# Patient Record
Sex: Female | Born: 1965
Health system: Southern US, Community
[De-identification: ages and names within clinical notes are randomized; demographics above are authoritative.]

## PROBLEM LIST (undated history)

## (undated) DIAGNOSIS — I10 Essential (primary) hypertension: Secondary | ICD-10-CM

## (undated) DIAGNOSIS — N183 Chronic kidney disease, stage 3 unspecified: Secondary | ICD-10-CM

## (undated) DIAGNOSIS — G459 Transient cerebral ischemic attack, unspecified: Secondary | ICD-10-CM

## (undated) DIAGNOSIS — D649 Anemia, unspecified: Secondary | ICD-10-CM

## (undated) DIAGNOSIS — E785 Hyperlipidemia, unspecified: Secondary | ICD-10-CM

## (undated) HISTORY — DX: Anemia, unspecified: D64.9

## (undated) HISTORY — DX: Transient cerebral ischemic attack, unspecified: G45.9

## (undated) HISTORY — DX: Essential (primary) hypertension: I10

## (undated) HISTORY — DX: Hyperlipidemia, unspecified: E78.5

## (undated) HISTORY — PX: TUBAL LIGATION: SHX77

## (undated) HISTORY — DX: Chronic kidney disease, stage 3 unspecified: N18.30

## (undated) HISTORY — DX: Chronic kidney disease, stage 3 (moderate): N18.3

---

## 1999-08-19 ENCOUNTER — Ambulatory Visit (HOSPITAL_COMMUNITY): Admission: RE | Admit: 1999-08-19 | Discharge: 1999-08-19 | Payer: Self-pay | Admitting: Orthopedic Surgery

## 2000-08-24 ENCOUNTER — Emergency Department (HOSPITAL_COMMUNITY): Admission: EM | Admit: 2000-08-24 | Discharge: 2000-08-24 | Payer: Self-pay | Admitting: Emergency Medicine

## 2000-08-26 ENCOUNTER — Emergency Department (HOSPITAL_COMMUNITY): Admission: EM | Admit: 2000-08-26 | Discharge: 2000-08-26 | Payer: Self-pay | Admitting: Emergency Medicine

## 2000-08-28 ENCOUNTER — Emergency Department (HOSPITAL_COMMUNITY): Admission: EM | Admit: 2000-08-28 | Discharge: 2000-08-28 | Payer: Self-pay | Admitting: *Deleted

## 2001-02-19 ENCOUNTER — Ambulatory Visit: Admission: RE | Admit: 2001-02-19 | Discharge: 2001-02-21 | Payer: Self-pay | Admitting: Obstetrics

## 2001-10-15 ENCOUNTER — Ambulatory Visit (HOSPITAL_COMMUNITY): Admission: RE | Admit: 2001-10-15 | Discharge: 2001-10-15 | Payer: Self-pay | Admitting: Obstetrics

## 2001-10-15 ENCOUNTER — Encounter: Payer: Self-pay | Admitting: Obstetrics

## 2001-10-24 ENCOUNTER — Ambulatory Visit (HOSPITAL_COMMUNITY): Admission: RE | Admit: 2001-10-24 | Discharge: 2001-10-24 | Payer: Self-pay | Admitting: Obstetrics

## 2005-03-28 ENCOUNTER — Ambulatory Visit (HOSPITAL_COMMUNITY): Admission: RE | Admit: 2005-03-28 | Discharge: 2005-03-28 | Payer: Self-pay | Admitting: Obstetrics

## 2006-09-21 ENCOUNTER — Encounter: Admission: RE | Admit: 2006-09-21 | Discharge: 2006-09-21 | Payer: Self-pay | Admitting: Internal Medicine

## 2007-02-06 ENCOUNTER — Encounter: Admission: RE | Admit: 2007-02-06 | Discharge: 2007-02-06 | Payer: Self-pay | Admitting: Internal Medicine

## 2008-05-06 ENCOUNTER — Other Ambulatory Visit: Admission: RE | Admit: 2008-05-06 | Discharge: 2008-05-06 | Payer: Self-pay | Admitting: Gynecology

## 2008-05-18 ENCOUNTER — Ambulatory Visit: Payer: Self-pay | Admitting: Internal Medicine

## 2008-05-18 DIAGNOSIS — K219 Gastro-esophageal reflux disease without esophagitis: Secondary | ICD-10-CM | POA: Insufficient documentation

## 2008-05-22 ENCOUNTER — Telehealth: Payer: Self-pay | Admitting: Internal Medicine

## 2008-05-25 LAB — CONVERTED CEMR LAB
Basophils Absolute: 0.1 10*3/uL (ref 0.0–0.1)
Basophils Relative: 0.9 % (ref 0.0–3.0)
Eosinophils Relative: 2 % (ref 0.0–5.0)
Hemoglobin: 12.9 g/dL (ref 12.0–15.0)
Lymphocytes Relative: 28 % (ref 12.0–46.0)
MCHC: 34.3 g/dL (ref 30.0–36.0)
MCV: 83.7 fL (ref 78.0–100.0)
Neutro Abs: 5 10*3/uL (ref 1.4–7.7)
RBC: 4.5 M/uL (ref 3.87–5.11)
Vitamin B-12: 489 pg/mL (ref 211–911)

## 2010-04-05 ENCOUNTER — Inpatient Hospital Stay (HOSPITAL_COMMUNITY)
Admission: EM | Admit: 2010-04-05 | Discharge: 2010-04-08 | Payer: Self-pay | Source: Home / Self Care | Attending: Internal Medicine | Admitting: Internal Medicine

## 2010-05-15 ENCOUNTER — Encounter: Payer: Self-pay | Admitting: Internal Medicine

## 2010-05-15 ENCOUNTER — Encounter: Payer: Self-pay | Admitting: Obstetrics

## 2010-07-04 LAB — BASIC METABOLIC PANEL
BUN: 13 mg/dL (ref 6–23)
CO2: 24 mEq/L (ref 19–32)
Chloride: 105 mEq/L (ref 96–112)
Creatinine, Ser: 1.3 mg/dL — ABNORMAL HIGH (ref 0.4–1.2)
Glucose, Bld: 107 mg/dL — ABNORMAL HIGH (ref 70–99)

## 2010-07-04 LAB — ALDOSTERONE + RENIN ACTIVITY W/ RATIO: ALDO / PRA Ratio: 0.8 Ratio — ABNORMAL LOW (ref 0.9–28.9)

## 2010-07-04 LAB — CATECHOLAMINES, FRACTIONATED, URINE, 24 HOUR
Creatinine, Urine mg/day-CATEUR: 1.86 g/(24.h) (ref 0.63–2.50)
Dopamine 24 Hr Urine: 295 mcg/24 h (ref 52–480)
Total urine volume: 1500 mL

## 2010-07-04 LAB — METANEPHRINES, URINE, 24 HOUR
Metanephrines, Ur: 208 mcg/24 h — ABNORMAL HIGH (ref 58–203)
Normetanephrine, 24H Ur: 424 mcg/24 h (ref 88–649)
Volume, Urine-METAN: 1500 mL

## 2010-07-05 LAB — RAPID URINE DRUG SCREEN, HOSP PERFORMED
Amphetamines: NOT DETECTED
Barbiturates: NOT DETECTED
Cocaine: NOT DETECTED
Opiates: NOT DETECTED
Tetrahydrocannabinol: NOT DETECTED

## 2010-07-05 LAB — CBC
HCT: 34.6 % — ABNORMAL LOW (ref 36.0–46.0)
HCT: 37.3 % (ref 36.0–46.0)
MCHC: 32.9 g/dL (ref 30.0–36.0)
MCV: 81 fL (ref 78.0–100.0)
MCV: 81.8 fL (ref 78.0–100.0)
Platelets: 285 10*3/uL (ref 150–400)
RBC: 4.56 MIL/uL (ref 3.87–5.11)
RDW: 14.4 % (ref 11.5–15.5)
WBC: 10.4 10*3/uL (ref 4.0–10.5)
WBC: 8.8 10*3/uL (ref 4.0–10.5)

## 2010-07-05 LAB — BASIC METABOLIC PANEL
BUN: 12 mg/dL (ref 6–23)
Chloride: 107 mEq/L (ref 96–112)
Creatinine, Ser: 1.19 mg/dL (ref 0.4–1.2)
Glucose, Bld: 94 mg/dL (ref 70–99)
Potassium: 3.2 mEq/L — ABNORMAL LOW (ref 3.5–5.1)

## 2010-07-05 LAB — DIFFERENTIAL
Basophils Absolute: 0 10*3/uL (ref 0.0–0.1)
Basophils Relative: 0 % (ref 0–1)
Eosinophils Absolute: 0.1 10*3/uL (ref 0.0–0.7)
Eosinophils Relative: 1 % (ref 0–5)
Lymphocytes Relative: 21 % (ref 12–46)
Monocytes Absolute: 0.6 10*3/uL (ref 0.1–1.0)

## 2010-07-05 LAB — POCT I-STAT, CHEM 8
BUN: 15 mg/dL (ref 6–23)
Chloride: 106 mEq/L (ref 96–112)
Chloride: 106 mEq/L (ref 96–112)
HCT: 40 % (ref 36.0–46.0)
HCT: 40 % (ref 36.0–46.0)
Hemoglobin: 13.6 g/dL (ref 12.0–15.0)
Potassium: 3.9 mEq/L (ref 3.5–5.1)
Potassium: 3.9 mEq/L (ref 3.5–5.1)
Sodium: 141 mEq/L (ref 135–145)
Sodium: 142 mEq/L (ref 135–145)

## 2010-07-05 LAB — LIPID PANEL
HDL: 70 mg/dL (ref >39)
Total CHOL/HDL Ratio: 2.9 RATIO
VLDL: 4 mg/dL (ref 0–40)

## 2010-07-05 LAB — HEMOGLOBIN A1C: Hgb A1c MFr Bld: 5.5 % (ref <5.7)

## 2010-09-01 ENCOUNTER — Other Ambulatory Visit (HOSPITAL_COMMUNITY): Payer: Self-pay | Admitting: Family Medicine

## 2010-09-01 DIAGNOSIS — Z1231 Encounter for screening mammogram for malignant neoplasm of breast: Secondary | ICD-10-CM

## 2010-09-09 NOTE — Op Note (Signed)
[facility]  Patient:    [name], [name] Visit Number: [id number] MRN: [id number]          Service Type: DSU Location: [facility] Attending Physician:  Venita Sheffield Dictated by:   Kathreen Cosier, M.D. Proc. Date: 10/24/01 Admit Date:  10/24/2001 Discharge Date: 10/24/2001                             Operative Report  PREOPERATIVE DIAGNOSIS:       Multiparity.  POSTOPERATIVE DIAGNOSIS:      Multiparity.  OPERATION:                    Open laparoscopic tubal sterilization.  SURGEON:                      Kathreen Cosier, M.D.  ASSISTANT:  ANESTHESIA:                   General anesthesia.  ESTIMATED BLOOD LOSS:  DESCRIPTION OF PROCEDURE:     Under general anesthesia with the patient in lithotomy position, the abdomen, peritoneum, and vagina were prepped and draped and the bladder emptied with a straight catheter.  A weighted speculum was placed in the vagina, anterior lip of the cervix was grasped with a Hulka tenaculum.  In the umbilicus, a transverse incision was made and carried down to the fascia.  The fascia was cleaned, grasped with two Kochers, and the fascia and peritoneum opened with Mayo scissors.  The sleeve of the trocar was inserted intraperitoneally.  3 liters of carbon dioxide infused intraperitoneally.  The visualizing scope was inserted.  The tubes and ovaries were normal.  There was a fundal myoma about 4 cm and three 2 cm posterior myomas.  The right tube was grasped one inch from the cornua with cautery and the tube was cauterized in a total of five places moving lateral from the first site of cautery.  The procedure was done in the exact fashion on the other side.  The probe was removed and CO2 allowed to escape from the peritoneal cavity.  The fascia was closed with one stitch of 0 Dexon.  The skin was closed with subcuticular stitch of 3-0 plain.  The patient tolerated the procedure well and taken to  the recovery room in good condition. Dictated by:   Kathreen Cosier, M.D. Attending Physician:  Venita Sheffield DD:  10/24/01 TD:  10/28/01 Job: [id number]

## 2010-09-12 ENCOUNTER — Ambulatory Visit (HOSPITAL_COMMUNITY): Payer: Self-pay

## 2010-09-16 ENCOUNTER — Ambulatory Visit (HOSPITAL_COMMUNITY): Payer: Self-pay

## 2010-09-26 ENCOUNTER — Ambulatory Visit (HOSPITAL_COMMUNITY)
Admission: RE | Admit: 2010-09-26 | Discharge: 2010-09-26 | Disposition: A | Discharge status: Home / Self Care | Payer: Self-pay | Source: Ambulatory Visit | Attending: Family Medicine | Admitting: Family Medicine

## 2010-09-26 DIAGNOSIS — Z1231 Encounter for screening mammogram for malignant neoplasm of breast: Secondary | ICD-10-CM | POA: Insufficient documentation

## 2010-09-26 LAB — HM MAMMOGRAPHY: HM Mammogram: NORMAL

## 2010-12-06 LAB — HM DIABETES EYE EXAM: HM Diabetic Eye Exam: NORMAL

## 2011-03-25 DIAGNOSIS — G459 Transient cerebral ischemic attack, unspecified: Secondary | ICD-10-CM

## 2011-03-25 HISTORY — DX: Transient cerebral ischemic attack, unspecified: G45.9

## 2011-05-31 ENCOUNTER — Other Ambulatory Visit (INDEPENDENT_AMBULATORY_CARE_PROVIDER_SITE_OTHER): Payer: BC Managed Care – PPO

## 2011-05-31 ENCOUNTER — Encounter: Payer: Self-pay | Admitting: Internal Medicine

## 2011-05-31 ENCOUNTER — Ambulatory Visit (INDEPENDENT_AMBULATORY_CARE_PROVIDER_SITE_OTHER): Payer: BC Managed Care – PPO | Admitting: Internal Medicine

## 2011-05-31 VITALS — BP 194/122 | HR 71 | Temp 98.6°F | Resp 14 | Ht 62.0 in | Wt 183.5 lb

## 2011-05-31 DIAGNOSIS — R739 Hyperglycemia, unspecified: Secondary | ICD-10-CM

## 2011-05-31 DIAGNOSIS — K219 Gastro-esophageal reflux disease without esophagitis: Secondary | ICD-10-CM

## 2011-05-31 DIAGNOSIS — D649 Anemia, unspecified: Secondary | ICD-10-CM

## 2011-05-31 DIAGNOSIS — G459 Transient cerebral ischemic attack, unspecified: Secondary | ICD-10-CM

## 2011-05-31 DIAGNOSIS — I10 Essential (primary) hypertension: Secondary | ICD-10-CM

## 2011-05-31 DIAGNOSIS — R7309 Other abnormal glucose: Secondary | ICD-10-CM

## 2011-05-31 LAB — CBC WITH DIFFERENTIAL/PLATELET
Basophils Absolute: 0.1 10*3/uL (ref 0.0–0.1)
Eosinophils Absolute: 0.2 10*3/uL (ref 0.0–0.7)
HCT: 39.6 % (ref 36.0–46.0)
Hemoglobin: 13.2 g/dL (ref 12.0–15.0)
Lymphs Abs: 2.5 10*3/uL (ref 0.7–4.0)
MCHC: 33.4 g/dL (ref 30.0–36.0)
Monocytes Absolute: 0.5 10*3/uL (ref 0.1–1.0)
Neutro Abs: 7.2 10*3/uL (ref 1.4–7.7)
Platelets: 344 10*3/uL (ref 150.0–400.0)
RDW: 14.1 % (ref 11.5–14.6)

## 2011-05-31 LAB — HEMOGLOBIN A1C: Hgb A1c MFr Bld: 5.7 % (ref 4.6–6.5)

## 2011-05-31 LAB — URINALYSIS, ROUTINE W REFLEX MICROSCOPIC
Bilirubin Urine: NEGATIVE
Ketones, ur: NEGATIVE
Specific Gravity, Urine: 1.025 (ref 1.000–1.030)
Urine Glucose: NEGATIVE
Urobilinogen, UA: 0.2 (ref 0.0–1.0)

## 2011-05-31 LAB — IBC PANEL
Iron: 22 ug/dL — ABNORMAL LOW (ref 42–145)
Saturation Ratios: 5.4 % — ABNORMAL LOW (ref 20.0–50.0)
Transferrin: 289.1 mg/dL (ref 212.0–360.0)

## 2011-05-31 LAB — LIPID PANEL
Cholesterol: 217 mg/dL — ABNORMAL HIGH (ref 0–200)
Total CHOL/HDL Ratio: 3
Triglycerides: 25 mg/dL (ref 0.0–149.0)

## 2011-05-31 LAB — VITAMIN B12: Vitamin B-12: 437 pg/mL (ref 211–911)

## 2011-05-31 LAB — COMPREHENSIVE METABOLIC PANEL
ALT: 15 U/L (ref 0–35)
AST: 18 U/L (ref 0–37)
Alkaline Phosphatase: 67 U/L (ref 39–117)
BUN: 18 mg/dL (ref 6–23)
Calcium: 8.8 mg/dL (ref 8.4–10.5)
Chloride: 103 mEq/L (ref 96–112)
Creatinine, Ser: 1.3 mg/dL — ABNORMAL HIGH (ref 0.4–1.2)
Total Bilirubin: 0.3 mg/dL (ref 0.3–1.2)

## 2011-05-31 MED ORDER — OLMESARTAN-AMLODIPINE-HCTZ 20-5-12.5 MG PO TABS: 1.0000 | ORAL_TABLET | Freq: Every day | ORAL | Status: DC

## 2011-05-31 MED ORDER — ROSUVASTATIN CALCIUM 10 MG PO TABS: 10.0000 mg | ORAL_TABLET | Freq: Every day | ORAL | Status: DC

## 2011-05-31 NOTE — Assessment & Plan Note (Signed)
Recheck CBC and check vitamin levels as well

## 2011-05-31 NOTE — Assessment & Plan Note (Signed)
Check an a1c today 

## 2011-05-31 NOTE — Progress Notes (Signed)
Subjective:    Patient ID: [name], female    DOB: 1965-08-05, 46 y.o.   MRN: [id number]  Hypertension This is a chronic problem. The current episode started more than 1 year ago. The problem has been gradually worsening since onset. The problem is uncontrolled. Pertinent negatives include no anxiety, blurred vision, chest pain, headaches, malaise/fatigue, neck pain, orthopnea, palpitations, peripheral edema, PND, shortness of breath or sweats. There are no associated agents to hypertension. Past treatments include nothing. Compliance problems include exercise and diet.  Hypertensive end-organ damage includes CVA.  Anemia Presents for follow-up visit. There has been no abdominal pain, anorexia, bruising/bleeding easily, confusion, fever, leg swelling, light-headedness, malaise/fatigue, pallor, palpitations, paresthesias, pica or weight loss. Signs of blood loss that are present include menorrhagia. Signs of blood loss that are not present include hematemesis, hematochezia and melena. There are no compliance problems.       Review of Systems  Constitutional: Negative for fever, chills, weight loss, malaise/fatigue, diaphoresis, activity change, appetite change, fatigue and unexpected weight change.  HENT: Negative.  Negative for neck pain.   Eyes: Negative.  Negative for blurred vision.  Respiratory: Negative for apnea, cough, choking, chest tightness, shortness of breath, wheezing and stridor.   Cardiovascular: Negative for chest pain, palpitations, orthopnea, leg swelling and PND.  Gastrointestinal: Negative for nausea, vomiting, abdominal pain, diarrhea, constipation, melena, hematochezia, abdominal distention, anorexia and hematemesis.  Genitourinary: Positive for menorrhagia.  Musculoskeletal: Negative for back pain, joint swelling, arthralgias and gait problem.  Skin: Negative for color change, pallor, rash and wound.  Neurological: Negative for dizziness, tremors, seizures,  syncope, facial asymmetry, speech difficulty, weakness, light-headedness, numbness, headaches and paresthesias.  Hematological: Negative for adenopathy. Does not bruise/bleed easily.  Psychiatric/Behavioral: Negative.  Negative for confusion.       Objective:   Physical Exam  Vitals reviewed. Constitutional: She is oriented to person, place, and time. She appears well-developed and well-nourished.  HENT:  Head: Normocephalic and atraumatic.  Nose: Nose normal.  Mouth/Throat: Oropharynx is clear and moist. No oropharyngeal exudate.  Eyes: Conjunctivae are normal. Right eye exhibits no discharge. Left eye exhibits no discharge. No scleral icterus.  Neck: Normal range of motion. Neck supple. No JVD present. No tracheal deviation present. No thyromegaly present.  Cardiovascular: Normal rate, regular rhythm, normal heart sounds and intact distal pulses.  Exam reveals no gallop and no friction rub.   No murmur heard. Pulmonary/Chest: Effort normal and breath sounds normal. No stridor. No respiratory distress. She has no wheezes. She has no rales. She exhibits no tenderness.  Abdominal: Soft. Bowel sounds are normal. She exhibits no distension and no mass. There is no tenderness. There is no rebound and no guarding.  Musculoskeletal: Normal range of motion. She exhibits no edema and no tenderness.  Lymphadenopathy:    She has no cervical adenopathy.  Neurological: She is alert and oriented to person, place, and time. She has normal reflexes. She displays normal reflexes. No cranial nerve deficit. She exhibits normal muscle tone. Coordination normal.  Skin: Skin is warm and dry. No rash noted. She is not diaphoretic. No erythema. No pallor.  Psychiatric: She has a normal mood and affect. Her behavior is normal. Judgment and thought content normal.     Lab Results  Component Value Date   WBC 10.4 04/06/2010   HGB 11.4* 04/06/2010   HCT 34.6* 04/06/2010   PLT 285 04/06/2010   GLUCOSE 107*  04/07/2010   CHOL  Value: 201  ATP III CLASSIFICATION:  <200     mg/dL   Desirable  045-409  mg/dL   Borderline High  >=811    mg/dL   High       * 91/47/8295   TRIG 22 04/06/2010   HDL 70 04/06/2010   LDLCALC  Value: 127        Total Cholesterol/HDL:CHD Risk Coronary Heart Disease Risk Table                     Men   Women  1/2 Average Risk   3.4   3.3  Average Risk       5.0   4.4  2 X Average Risk   9.6   7.1  3 X Average Risk  23.4   11.0        Use the calculated Patient Ratio above and the CHD Risk Table to determine the patient's CHD Risk.        ATP III CLASSIFICATION (LDL):  <100     mg/dL   Optimal  621-308  mg/dL   Near or Above                    Optimal  130-159  mg/dL   Borderline  657-846  mg/dL   High  >962     mg/dL   Very High* 95/28/4132   NA 137 04/07/2010   K 4.7 HEMOLYZED SPECIMEN, RESULTS MAY BE AFFECTED 04/07/2010   CL 105 04/07/2010   CREATININE 1.30* 04/07/2010   BUN 13 04/07/2010   CO2 24 04/07/2010   TSH 2.328 04/06/2010   HGBA1C  Value: 5.5 (NOTE)                                                                       According to the ADA Clinical Practice Recommendations for 2011, when HbA1c is used as a screening test:   >=6.5%   Diagnostic of Diabetes Mellitus           (if abnormal result  is confirmed)  5.7-6.4%   Increased risk of developing Diabetes Mellitus  References:Diagnosis and Classification of Diabetes Mellitus,Diabetes Care,2011,34(Suppl 1):S62-S69 and Standards of Medical Care in         Diabetes - 2011,Diabetes Care,2011,34  (Suppl 1):S11-S61. 04/06/2010       Assessment & Plan:

## 2011-05-31 NOTE — Patient Instructions (Signed)

## 2011-05-31 NOTE — Assessment & Plan Note (Signed)
Reduce risk of CVA with BP control, asa, and statin therapy. She will also start lifestyle modifications with diet, exercise, and weight loss.

## 2011-05-31 NOTE — Assessment & Plan Note (Signed)
Start tribenzor

## 2011-06-01 ENCOUNTER — Encounter: Payer: Self-pay | Admitting: Internal Medicine

## 2011-08-31 ENCOUNTER — Telehealth: Payer: Self-pay | Admitting: Internal Medicine

## 2011-08-31 NOTE — Telephone Encounter (Signed)
Pt says tribenzor works and she desire samples before she have surgery on 09/11/11--pt ph# [phone]

## 2011-08-31 NOTE — Telephone Encounter (Signed)
Samples are on LaKisha's desk 

## 2011-09-04 NOTE — Telephone Encounter (Signed)
Patient notified

## 2011-09-07 ENCOUNTER — Telehealth: Payer: Self-pay

## 2011-09-07 MED ORDER — OLMESARTAN-AMLODIPINE-HCTZ 40-5-12.5 MG PO TABS: 1.0000 | ORAL_TABLET | Freq: Every day | ORAL | Status: DC

## 2011-09-07 NOTE — Telephone Encounter (Signed)
Ok to change strength to tribenzor 40 - 5 - 12.5 (from 20-5-12.5) - done per emr  Pt to continue to monitor BP at home, and please make ROV in 2-3 wks with Dr Yetta Barre

## 2011-09-07 NOTE — Telephone Encounter (Signed)
Faxed hardcopy to pharmacy. 

## 2011-09-07 NOTE — Telephone Encounter (Signed)
Called the patient left message to call back 

## 2011-09-07 NOTE — Telephone Encounter (Signed)
Pt called stating that Tribenzor has not been completely control her BP. Last night 153-98, 164/104 and this morning143/97. Pt denied HA, dizziness or blurred vision. Pt is concerned about reading especially because she is scheduled for hysterectomy Monday. Pt is requesting advisement from MD, can she take Tribenzor BID instead or can an additional medication be Rx'd for BP control. Please advise in Dr Yetta Barre' absence, thanks!

## 2011-09-08 ENCOUNTER — Encounter (HOSPITAL_COMMUNITY)
Admission: RE | Admit: 2011-09-08 | Discharge: 2011-09-08 | Disposition: A | Discharge status: Home / Self Care | Payer: BC Managed Care – PPO | Source: Ambulatory Visit | Attending: Obstetrics & Gynecology | Admitting: Obstetrics & Gynecology

## 2011-09-08 ENCOUNTER — Encounter (HOSPITAL_COMMUNITY): Payer: Self-pay

## 2011-09-08 ENCOUNTER — Other Ambulatory Visit: Payer: Self-pay

## 2011-09-08 ENCOUNTER — Telehealth: Payer: Self-pay | Admitting: *Deleted

## 2011-09-08 ENCOUNTER — Encounter (HOSPITAL_COMMUNITY): Payer: Self-pay | Admitting: Pharmacist

## 2011-09-08 LAB — BASIC METABOLIC PANEL
Calcium: 9.4 mg/dL (ref 8.4–10.5)
Creatinine, Ser: 1.2 mg/dL — ABNORMAL HIGH (ref 0.50–1.10)
GFR calc Af Amer: 62 mL/min — ABNORMAL LOW (ref >90)
Sodium: 136 mEq/L (ref 135–145)

## 2011-09-08 LAB — CBC
MCH: 25.9 pg — ABNORMAL LOW (ref 26.0–34.0)
MCV: 81 fL (ref 78.0–100.0)
Platelets: 381 10*3/uL (ref 150–400)
RDW: 15.7 % — ABNORMAL HIGH (ref 11.5–15.5)
WBC: 9.9 10*3/uL (ref 4.0–10.5)

## 2011-09-08 LAB — SURGICAL PCR SCREEN
MRSA, PCR: NEGATIVE
Staphylococcus aureus: NEGATIVE

## 2011-09-08 NOTE — Telephone Encounter (Signed)
[facility] informed of MD's advisement.

## 2011-09-08 NOTE — Patient Instructions (Addendum)
20 [name]  09/08/2011   Your procedure is scheduled on:  09/11/11  Enter through the Main Entrance of [facility] at 6 AM.  Pick up the phone at the desk and dial [phone].   Call this number if you have problems the morning of surgery: [phone]   Remember:   Do not eat food:After Midnight.  Do not drink clear liquids: After Midnight.  Take these medicines the morning of surgery with A SIP OF WATER: Blood pressure medication   Do not wear jewelry, make-up or nail polish.  Do not wear lotions, powders, or perfumes. You may wear deodorant.  Do not shave 48 hours prior to surgery.  Do not bring valuables to the hospital.  Contacts, dentures or bridgework may not be worn into surgery.  Leave suitcase in the car. After surgery it may be brought to your room.  For patients admitted to the hospital, checkout time is 11:00 AM the day of discharge.   Patients discharged the day of surgery will not be allowed to drive home.  Name and phone number of your driver: NA  Special Instructions: CHG Shower Use Special Wash: 1/2 bottle night before surgery and 1/2 bottle morning of surgery.   Please read over the following fact sheets that you were given: MRSA Information

## 2011-09-08 NOTE — Telephone Encounter (Signed)
I suspect even one dose will be too much and i would not recommend  Pt will need to start samples if needed now if cannot afford, may need to re-schedule surgury and will need OV with dr Yetta Barre in 3-5 days after start higher dose

## 2011-09-08 NOTE — Pre-Procedure Instructions (Signed)
History and EKG reviewed by Dr. Rodman Pickle. Call to Dr Jonny Ruiz to possibly double blood pressure medication per Dr Rodman Pickle request, but nurse states he does not feel comfortable in doing so. Dr. Rodman Pickle notified, no further orders given.

## 2011-09-08 NOTE — Telephone Encounter (Signed)
[facility] called-pt's BP is still elevated and pt has surgery on Monday for hysterectomy-pt went to pick up Tribenzor 40-5 12.5 mg dose but it was too expensive-they want to know if it is okay for pt to double her 20-5 12.5 dosage.

## 2011-09-10 MED ORDER — CEFAZOLIN SODIUM-DEXTROSE 2-3 GM-% IV SOLR
2.0000 g | INTRAVENOUS | Status: AC
  Administered 2011-09-11: 2 g via INTRAVENOUS
  Filled 2011-09-10: qty 50

## 2011-09-10 NOTE — H&P (Signed)
[name], [name]             ACCOUNT NO.:  [id number]  MEDICAL RECORD NO.:  [id number]  LOCATION:                                 FACILITY:  PHYSICIAN:  [name], M.D.   DATE OF BIRTH:  [birth year]  DATE OF ADMISSION:  09/11/2011 DATE OF DISCHARGE:                             HISTORY & PHYSICAL   ADMITTING DIAGNOSES:  Uterine fibroids/leiomyomata, clinical history of dysfunctional uterine bleeding and menorrhagia.  The patient is a 46 year old black single female, gravida 1, para 1, who had surgical sterilization in 2004 and was kindly referred by Dr. Teodora [name] who saw her for evaluation of her dysfunctional bleeding and annual examination in March of this year.  Subsequent D and C in the office revealed benign pathology.  Pelvic ultrasound in the office was remarkable for numerous leiomyomata, the 3 largest being 5.1 cm, 4.7 cm, 4.9 cm.  There were at least 3 others measuring more than or equal to 1.6 cm.  There was a simple ovarian cyst on the right.  The date of that ultrasound was July 27, 2011.  There was mild hydronephrosis on the right ureter.  The patient was seen in consultation with me and is admitted now for definitive surgery, specifically total abdominal hysterectomy, bilateral salpingo-oophorectomy.  Potential risks of the procedure have been discussed with the patient at length.  She has reviewed ACOG materials on the procedure which includes discussion of risk of blood clots in the legs, injury to other organs, infection, injury to vasculature with hemorrhage and risk of postoperative infection.  Appropriate prophylactic measures have also been discussed with the patient.  The option of retaining the ovaries has been discussed, and she has elected to proceed with bilateral salpingo- oophorectomy with postoperative plan for hormone replacement therapy.  CURRENT REVIEW OF SYSTEMS:  Otherwise negative.  She does not have any cardiopulmonary, GI, or GU  complaints.  PAST MEDICAL HISTORY:  The patient is known to have hypertension.  She has no other significant medical conditions.  Her only medication is Tribenzor 20 mg/5 mg/12.5 mg.  Her only known medication allergy is to tetanus.  She does not use cigarettes.  She does use alcohol but only socially.  Her primary care physician is [name].  FAMILY HISTORY:  Remarkable for kidney disease, for diabetes, for hypertension, and for cancer.  PHYSICAL EXAMINATION:  HEENT:  Grossly within normal limits. NECK:  The thyroid gland is not palpably enlarged to my examination. The patient had a blood pressure of 172/120 in the office but had been off her antihypertensive medication for 3 weeks, only restarted 2 days prior to her visit with me.  Her weight is 190.  Her height is 5 feet 2- 3/4 inches. CHEST:  Clear to auscultation throughout. HEART:  Normal sinus rhythm without murmurs, rubs, or gallops. ABDOMEN:  Soft and nontender.  There is no appreciable organomegaly except for the uterus palpable above the symphysis.  There is no CVA tenderness. EXTREMITIES:  Without cyanosis, clubbing, or edema. PELVIC:  External genitalia, vagina, and cervix were normal to inspection.  Bimanual exam reveals the uterus to be 14-[redacted] weeks gestational size and irregularly nodular.  The rectum is  palpably normal and confirms the above findings.  ASSESSMENT:  Numerous large leiomyomata.  Previous surgical sterilization.  Clinical symptoms of menorrhagia and dysfunctional bleeding with negative pathology and D and C specimen.  PLAN:  Total abdominal hysterectomy, bilateral salpingo-oophorectomy.     [name], M.D.     WRN/MEDQ  D:  09/10/2011  T:  09/10/2011  Job:  [id number]

## 2011-09-11 ENCOUNTER — Inpatient Hospital Stay (HOSPITAL_COMMUNITY): Payer: BC Managed Care – PPO | Admitting: Anesthesiology

## 2011-09-11 ENCOUNTER — Encounter (HOSPITAL_COMMUNITY): Payer: Self-pay | Admitting: General Practice

## 2011-09-11 ENCOUNTER — Inpatient Hospital Stay (HOSPITAL_COMMUNITY)
Admission: RE | Admit: 2011-09-11 | Discharge: 2011-09-13 | DRG: 359 | Disposition: A | Discharge status: Home / Self Care | Payer: BC Managed Care – PPO | Source: Ambulatory Visit | Attending: Obstetrics & Gynecology | Admitting: Obstetrics & Gynecology

## 2011-09-11 ENCOUNTER — Encounter (HOSPITAL_COMMUNITY)
Admission: RE | Disposition: A | Discharge status: Home / Self Care | Payer: Self-pay | Source: Ambulatory Visit | Attending: Obstetrics & Gynecology

## 2011-09-11 ENCOUNTER — Encounter (HOSPITAL_COMMUNITY): Payer: Self-pay | Admitting: Anesthesiology

## 2011-09-11 DIAGNOSIS — N8000 Endometriosis of the uterus, unspecified: Secondary | ICD-10-CM | POA: Diagnosis present

## 2011-09-11 DIAGNOSIS — Z01812 Encounter for preprocedural laboratory examination: Secondary | ICD-10-CM

## 2011-09-11 DIAGNOSIS — Z01818 Encounter for other preprocedural examination: Secondary | ICD-10-CM

## 2011-09-11 DIAGNOSIS — N920 Excessive and frequent menstruation with regular cycle: Secondary | ICD-10-CM | POA: Diagnosis present

## 2011-09-11 DIAGNOSIS — N938 Other specified abnormal uterine and vaginal bleeding: Secondary | ICD-10-CM | POA: Diagnosis present

## 2011-09-11 DIAGNOSIS — I10 Essential (primary) hypertension: Secondary | ICD-10-CM | POA: Diagnosis present

## 2011-09-11 DIAGNOSIS — N949 Unspecified condition associated with female genital organs and menstrual cycle: Secondary | ICD-10-CM | POA: Diagnosis present

## 2011-09-11 DIAGNOSIS — D252 Subserosal leiomyoma of uterus: Secondary | ICD-10-CM | POA: Diagnosis present

## 2011-09-11 DIAGNOSIS — N9489 Other specified conditions associated with female genital organs and menstrual cycle: Secondary | ICD-10-CM | POA: Diagnosis present

## 2011-09-11 DIAGNOSIS — D251 Intramural leiomyoma of uterus: Principal | ICD-10-CM | POA: Diagnosis present

## 2011-09-11 DIAGNOSIS — N831 Corpus luteum cyst of ovary, unspecified side: Secondary | ICD-10-CM | POA: Diagnosis present

## 2011-09-11 HISTORY — PX: ABDOMINAL HYSTERECTOMY: SHX81

## 2011-09-11 HISTORY — PX: SALPINGOOPHORECTOMY: SHX82

## 2011-09-11 SURGERY — HYSTERECTOMY, ABDOMINAL
Anesthesia: General | Site: Abdomen | Wound class: Clean Contaminated

## 2011-09-11 MED ORDER — SIMETHICONE 80 MG PO CHEW: 80.0000 mg | CHEWABLE_TABLET | Freq: Four times a day (QID) | ORAL | Status: DC | PRN

## 2011-09-11 MED ORDER — LACTATED RINGERS IV SOLN
INTRAVENOUS | Status: DC
  Administered 2011-09-11: 07:00:00 via INTRAVENOUS

## 2011-09-11 MED ORDER — HYDROMORPHONE HCL PF 1 MG/ML IJ SOLN
INTRAMUSCULAR | Status: DC | PRN
  Administered 2011-09-11: 1 mg via INTRAVENOUS

## 2011-09-11 MED ORDER — LABETALOL HCL 5 MG/ML IV SOLN
INTRAVENOUS | Status: DC | PRN
  Administered 2011-09-11 (×2): 5 mg via INTRAVENOUS

## 2011-09-11 MED ORDER — HYDROCHLOROTHIAZIDE 12.5 MG PO CAPS
12.5000 mg | ORAL_CAPSULE | Freq: Every day | ORAL | Status: DC
  Administered 2011-09-12 – 2011-09-13 (×2): 12.5 mg via ORAL
  Filled 2011-09-11 (×3): qty 1

## 2011-09-11 MED ORDER — MENTHOL 3 MG MT LOZG: 1.0000 | LOZENGE | OROMUCOSAL | Status: DC | PRN

## 2011-09-11 MED ORDER — ONDANSETRON HCL 4 MG PO TABS: 4.0000 mg | ORAL_TABLET | Freq: Four times a day (QID) | ORAL | Status: DC | PRN

## 2011-09-11 MED ORDER — HYDROMORPHONE HCL PF 1 MG/ML IJ SOLN
INTRAMUSCULAR | Status: AC
  Administered 2011-09-11: 0.5 mg via INTRAVENOUS
  Filled 2011-09-11: qty 1

## 2011-09-11 MED ORDER — PROPOFOL 10 MG/ML IV EMUL
INTRAVENOUS | Status: DC | PRN
  Administered 2011-09-11: 200 mg via INTRAVENOUS

## 2011-09-11 MED ORDER — LIDOCAINE HCL (CARDIAC) 20 MG/ML IV SOLN
INTRAVENOUS | Status: AC
  Filled 2011-09-11: qty 5

## 2011-09-11 MED ORDER — KETOROLAC TROMETHAMINE 30 MG/ML IJ SOLN
INTRAMUSCULAR | Status: AC
  Filled 2011-09-11: qty 1

## 2011-09-11 MED ORDER — SUFENTANIL CITRATE 50 MCG/ML IV SOLN
INTRAVENOUS | Status: AC
  Filled 2011-09-11: qty 1

## 2011-09-11 MED ORDER — OXYCODONE-ACETAMINOPHEN 5-325 MG PO TABS
1.0000 | ORAL_TABLET | ORAL | Status: DC | PRN
  Administered 2011-09-12 (×2): 2 via ORAL
  Administered 2011-09-13: 1 via ORAL
  Filled 2011-09-11 (×2): qty 2
  Filled 2011-09-11: qty 1

## 2011-09-11 MED ORDER — DEXTROSE-NACL 5-0.45 % IV SOLN
INTRAVENOUS | Status: DC
  Administered 2011-09-11: 17:00:00 via INTRAVENOUS

## 2011-09-11 MED ORDER — ZOLPIDEM TARTRATE 5 MG PO TABS: 5.0000 mg | ORAL_TABLET | Freq: Every evening | ORAL | Status: DC | PRN

## 2011-09-11 MED ORDER — LIDOCAINE HCL (CARDIAC) 20 MG/ML IV SOLN
INTRAVENOUS | Status: DC | PRN
  Administered 2011-09-11: 100 mg via INTRAVENOUS

## 2011-09-11 MED ORDER — DIPHENHYDRAMINE HCL 50 MG/ML IJ SOLN: 12.5000 mg | Freq: Four times a day (QID) | INTRAMUSCULAR | Status: DC | PRN

## 2011-09-11 MED ORDER — DEXAMETHASONE SODIUM PHOSPHATE 10 MG/ML IJ SOLN
INTRAMUSCULAR | Status: AC
  Filled 2011-09-11: qty 1

## 2011-09-11 MED ORDER — OLMESARTAN-AMLODIPINE-HCTZ 40-5-12.5 MG PO TABS: 1.0000 | ORAL_TABLET | Freq: Every day | ORAL | Status: DC

## 2011-09-11 MED ORDER — DEXAMETHASONE SODIUM PHOSPHATE 4 MG/ML IJ SOLN
INTRAMUSCULAR | Status: DC | PRN
  Administered 2011-09-11 (×2): 5 mg via INTRAVENOUS

## 2011-09-11 MED ORDER — 0.9 % SODIUM CHLORIDE (POUR BTL) OPTIME
TOPICAL | Status: DC | PRN
  Administered 2011-09-11: 1000 mL

## 2011-09-11 MED ORDER — NALOXONE HCL 0.4 MG/ML IJ SOLN: 0.4000 mg | INTRAMUSCULAR | Status: DC | PRN

## 2011-09-11 MED ORDER — DOCUSATE SODIUM 100 MG PO CAPS
100.0000 mg | ORAL_CAPSULE | Freq: Two times a day (BID) | ORAL | Status: DC
  Administered 2011-09-11 – 2011-09-12 (×3): 100 mg via ORAL
  Filled 2011-09-11 (×3): qty 1

## 2011-09-11 MED ORDER — ONDANSETRON HCL 4 MG/2ML IJ SOLN
INTRAMUSCULAR | Status: AC
  Filled 2011-09-11: qty 2

## 2011-09-11 MED ORDER — MIDAZOLAM HCL 5 MG/5ML IJ SOLN
INTRAMUSCULAR | Status: DC | PRN
  Administered 2011-09-11: .5 mg via INTRAVENOUS
  Administered 2011-09-11: 1.5 mg via INTRAVENOUS

## 2011-09-11 MED ORDER — HYDROMORPHONE HCL PF 1 MG/ML IJ SOLN
0.2500 mg | INTRAMUSCULAR | Status: DC | PRN
  Administered 2011-09-11 (×3): 0.5 mg via INTRAVENOUS

## 2011-09-11 MED ORDER — GLYCOPYRROLATE 0.2 MG/ML IJ SOLN
INTRAMUSCULAR | Status: AC
  Filled 2011-09-11: qty 2

## 2011-09-11 MED ORDER — DIPHENHYDRAMINE HCL 12.5 MG/5ML PO ELIX
12.5000 mg | ORAL_SOLUTION | Freq: Four times a day (QID) | ORAL | Status: DC | PRN
  Filled 2011-09-11: qty 5

## 2011-09-11 MED ORDER — SUFENTANIL CITRATE 50 MCG/ML IV SOLN
INTRAVENOUS | Status: DC | PRN
  Administered 2011-09-11: 20 ug via INTRAVENOUS
  Administered 2011-09-11 (×3): 10 ug via INTRAVENOUS

## 2011-09-11 MED ORDER — HYDROMORPHONE HCL PF 1 MG/ML IJ SOLN
INTRAMUSCULAR | Status: AC
  Filled 2011-09-11: qty 1

## 2011-09-11 MED ORDER — ONDANSETRON HCL 4 MG/2ML IJ SOLN: 4.0000 mg | Freq: Four times a day (QID) | INTRAMUSCULAR | Status: DC | PRN

## 2011-09-11 MED ORDER — HYDROMORPHONE 0.3 MG/ML IV SOLN
INTRAVENOUS | Status: DC
  Administered 2011-09-11: 5 mg via INTRAVENOUS
  Administered 2011-09-11: 11:00:00 via INTRAVENOUS
  Administered 2011-09-11: 1.5 mg via INTRAVENOUS
  Administered 2011-09-11: 5 mg via INTRAVENOUS
  Administered 2011-09-12 (×2): 0.6 mg via INTRAVENOUS
  Filled 2011-09-11: qty 25

## 2011-09-11 MED ORDER — ROCURONIUM BROMIDE 50 MG/5ML IV SOLN
INTRAVENOUS | Status: AC
  Filled 2011-09-11: qty 2

## 2011-09-11 MED ORDER — ONDANSETRON HCL 4 MG/2ML IJ SOLN
INTRAMUSCULAR | Status: DC | PRN
  Administered 2011-09-11 (×2): 2 mg via INTRAVENOUS

## 2011-09-11 MED ORDER — IRBESARTAN 300 MG PO TABS
300.0000 mg | ORAL_TABLET | Freq: Every day | ORAL | Status: DC
  Filled 2011-09-11: qty 1

## 2011-09-11 MED ORDER — IBUPROFEN 600 MG PO TABS
600.0000 mg | ORAL_TABLET | Freq: Four times a day (QID) | ORAL | Status: DC | PRN
  Administered 2011-09-12: 600 mg via ORAL
  Filled 2011-09-11 (×2): qty 1

## 2011-09-11 MED ORDER — BISACODYL 10 MG RE SUPP: 10.0000 mg | Freq: Every day | RECTAL | Status: DC | PRN

## 2011-09-11 MED ORDER — MIDAZOLAM HCL 2 MG/2ML IJ SOLN
INTRAMUSCULAR | Status: AC
  Filled 2011-09-11: qty 2

## 2011-09-11 MED ORDER — IRBESARTAN 300 MG PO TABS
300.0000 mg | ORAL_TABLET | Freq: Every day | ORAL | Status: DC
  Administered 2011-09-12 – 2011-09-13 (×2): 300 mg via ORAL
  Filled 2011-09-11 (×3): qty 1

## 2011-09-11 MED ORDER — LABETALOL HCL 5 MG/ML IV SOLN
INTRAVENOUS | Status: AC
  Filled 2011-09-11: qty 4

## 2011-09-11 MED ORDER — GLYCOPYRROLATE 0.2 MG/ML IJ SOLN
INTRAMUSCULAR | Status: DC | PRN
  Administered 2011-09-11: .8 mg via INTRAVENOUS

## 2011-09-11 MED ORDER — PROPOFOL 10 MG/ML IV EMUL
INTRAVENOUS | Status: AC
  Filled 2011-09-11: qty 20

## 2011-09-11 MED ORDER — AMLODIPINE BESYLATE 5 MG PO TABS
5.0000 mg | ORAL_TABLET | Freq: Every day | ORAL | Status: DC
  Administered 2011-09-12 – 2011-09-13 (×2): 5 mg via ORAL
  Filled 2011-09-11 (×3): qty 1

## 2011-09-11 MED ORDER — SODIUM CHLORIDE 0.9 % IJ SOLN: 9.0000 mL | INTRAMUSCULAR | Status: DC | PRN

## 2011-09-11 MED ORDER — NEOSTIGMINE METHYLSULFATE 1 MG/ML IJ SOLN
INTRAMUSCULAR | Status: DC | PRN
  Administered 2011-09-11: 5 mg via INTRAVENOUS

## 2011-09-11 MED ORDER — NEOSTIGMINE METHYLSULFATE 1 MG/ML IJ SOLN
INTRAMUSCULAR | Status: AC
  Filled 2011-09-11: qty 10

## 2011-09-11 MED ORDER — ROCURONIUM BROMIDE 100 MG/10ML IV SOLN
INTRAVENOUS | Status: DC | PRN
  Administered 2011-09-11: 50 mg via INTRAVENOUS

## 2011-09-11 MED ORDER — ALUM & MAG HYDROXIDE-SIMETH 200-200-20 MG/5ML PO SUSP: 30.0000 mL | ORAL | Status: DC | PRN

## 2011-09-11 SURGICAL SUPPLY — 37 items
CANISTER SUCTION 2500CC (MISCELLANEOUS) ×3 IMPLANT
CLOTH BEACON ORANGE TIMEOUT ST (SAFETY) ×3 IMPLANT
CONT PATH 16OZ SNAP LID 3702 (MISCELLANEOUS) ×3 IMPLANT
DECANTER SPIKE VIAL GLASS SM (MISCELLANEOUS) IMPLANT
DRSG COVADERM 4X10 (GAUZE/BANDAGES/DRESSINGS) ×3 IMPLANT
ELECT BLADE 6 FLAT ULTRCLN (ELECTRODE) IMPLANT
ELECT CAUTERY BLADE 6.4 (BLADE) ×3 IMPLANT
ELECT LIGASURE LONG (ELECTRODE) IMPLANT
GLOVE BIO SURGEON STRL SZ7.5 (GLOVE) ×3 IMPLANT
GOWN PREVENTION PLUS LG XLONG (DISPOSABLE) ×9 IMPLANT
GOWN PREVENTION PLUS XLARGE (GOWN DISPOSABLE) ×3 IMPLANT
NS IRRIG 1000ML POUR BTL (IV SOLUTION) ×3 IMPLANT
PACK ABDOMINAL GYN (CUSTOM PROCEDURE TRAY) ×3 IMPLANT
PAD ABD 7.5X8 STRL (GAUZE/BANDAGES/DRESSINGS) ×3 IMPLANT
PAD OB MATERNITY 4.3X12.25 (PERSONAL CARE ITEMS) ×3 IMPLANT
PROTECTOR NERVE ULNAR (MISCELLANEOUS) ×3 IMPLANT
SPONGE LAP 18X18 X RAY DECT (DISPOSABLE) ×3 IMPLANT
SPONGE SURGIFOAM ABS GEL 12-7 (HEMOSTASIS) IMPLANT
STAPLER VISISTAT 35W (STAPLE) ×3 IMPLANT
STRIP CLOSURE SKIN 1/4X4 (GAUZE/BANDAGES/DRESSINGS) ×3 IMPLANT
SUT CHROMIC 3 0 SH 27 (SUTURE) IMPLANT
SUT CHROMIC 3 0 TIES (SUTURE) IMPLANT
SUT ETHILON 3 0 FSL (SUTURE) IMPLANT
SUT MNCRL 0 MO-4 VIOLET 18 CR (SUTURE) ×4 IMPLANT
SUT MNCRL 0 VIOLET 6X18 (SUTURE) ×2 IMPLANT
SUT MNCRL AB 0 CT1 27 (SUTURE) ×3 IMPLANT
SUT MON AB 2-0 CT1 27 (SUTURE) IMPLANT
SUT MONOCRYL 0 6X18 (SUTURE) ×1
SUT MONOCRYL 0 MO 4 18  CR/8 (SUTURE) ×2
SUT PDS AB 0 CTX 60 (SUTURE) ×3 IMPLANT
SUT PLAIN 2 0 XLH (SUTURE) IMPLANT
SUT PROLENE 3 0 FS 2 (SUTURE) IMPLANT
SYR TB 1ML 25GX5/8 (SYRINGE) ×3 IMPLANT
TAPE CLOTH SURG 4X10 WHT LF (GAUZE/BANDAGES/DRESSINGS) ×3 IMPLANT
TOWEL OR 17X24 6PK STRL BLUE (TOWEL DISPOSABLE) ×6 IMPLANT
TRAY FOLEY CATH 14FR (SET/KITS/TRAYS/PACK) ×3 IMPLANT
WATER STERILE IRR 1000ML POUR (IV SOLUTION) IMPLANT

## 2011-09-11 NOTE — Anesthesia Postprocedure Evaluation (Signed)
Anesthesia Post Note  Patient: [name]  Procedure(s) Performed: Procedure(s) (LRB): HYSTERECTOMY ABDOMINAL (N/A) SALPINGO OOPHERECTOMY (Bilateral)  Anesthesia type: General  Patient location: PACU  Post pain: Pain level controlled  Post assessment: Post-op Vital signs reviewed  Last Vitals:  Filed Vitals:   09/11/11 0915  BP: 143/74  Pulse: 68  Temp:   Resp: 15    Post vital signs: Reviewed  Level of consciousness: sedated  Complications: No apparent anesthesia complicationsfj

## 2011-09-11 NOTE — Op Note (Signed)
Op note 14 weeks fibroids, aub, menorrhagia TAH/BSO [name] Asst Lowe Anesthesia GET EBL 300cc Findings :  14 weeks fibroids complcations None

## 2011-09-11 NOTE — Op Note (Signed)
NAMEKATHALEYA, [name]             ACCOUNT NO.:  [id number]  MEDICAL RECORD NO.:  [id number]  LOCATION:  9303                          FACILITY:  [facility]  PHYSICIAN:  Freddy Finner, M.D.   DATE OF BIRTH:  January 31, 1966  DATE OF PROCEDURE:  09/11/2011 DATE OF DISCHARGE:                              OPERATIVE REPORT   PREOPERATIVE DIAGNOSES:  Uterine leiomyomata, clinical symptoms of menorrhagia, dysfunctional uterine bleeding.  POSTOPERATIVE DIAGNOSES:  Uterine leiomyomata, clinical symptoms of menorrhagia, dysfunctional uterine bleeding.  OPERATIVE PROCEDURE:  Total abdominal hysterectomy and bilateral salpingo-oophorectomy.  SURGEON:  Freddy Finner, MD  ASSISTANT:  Dineen Kid. Rana Snare, MD  ESTIMATED INTRAOPERATIVE BLOOD LOSS:  300 mL.  ANESTHESIA:  General endotracheal.  INTRAOPERATIVE COMPLICATIONS:  None.  Details of the present illness recorded in the admission note.  The patient was admitted on the morning of surgery.  She was given a bolus of Ancef IV.  She was placed in PAS hose.  She was brought to the operating room, there placed under adequate general endotracheal anesthesia, placed in the dorsal recumbent position.  The abdomen, perineum and vagina were prepped in the usual fashion.  Foley catheter was placed using sterile technique.  Sterile drapes were applied.  A lower abdominal transverse incision was made just above the symphysis. The incision was extended down to the fascia with sharp dissection.  The fascia was entered sharply and extended to the external skin incision with Mayo scissors in either direction.  The rectus sheath was developed superiorly and inferiorly with blunt and sharp dissection.  Rectus muscles were divided in the midline.  Peritoneum was entered with blunt finger dissection.  The incision was extended to the external skin incision.  Self-retaining O'Connor-O'Sullivan retractor was placed after exploration of the upper abdomen revealed no  apparent abnormalities including the liver edge, gallbladder, kidneys.  At the conclusion of the procedure, the appendix was also visualized and was found to be normal.  Moist packs were used to pack the intestinal contents out of the pelvis.  The uterus was enlarged and filling the pelvis.  Kelly clamp was placed on the right broad ligament, round ligament adjacent to the cervix.  The LigaSure device was then used to develop serial pedicles including the round ligament, infundibulopelvic ligament, upper broad ligament.  The left side was then treated essentially identically except the device was not operational for portion of the procedure and the infundibulopelvic ligament was suture ligated with 0 Monocryl x2. The LigaSure was then used to complete the dissection down to the level of the uterine arteries on the left side.  Bladder was released from the lower segment and cervix and bluntly dissected off the lower segment. Vessel pedicles were then sealed and divided with LigaSure.  The cardinal ligament pedicles were likewise sealed and divided.  A curved Haney was placed on the uterosacrals across the angle of the vagina and the cervix was excised.  Please note that the uterus was removed prior to dissecting the cervix because of limited visibility.  Angles of the vagina were anchored to uterosacrals with Heaney suture of 0 Monocryl. Cuff was closed with figure-of-eights of 0 Monocryl.  The uterosacrals were plicated and  peritoneum of the bladder flap approximated in a single suture of 0 Monocryl.  Irrigation was carried out.  Small amount of bleeding was controlled on the right with Bovie and LigaSure.  This produced complete hemostasis.  As noted, the appendix was visualized was normal.  All pack needle and instrument counts were correct.  The incision was then closed in layers.  Running 0 Monocryl was used to close the peritoneum and reapproximate the rectus muscles in the midline.   The fascia was closed with a double 0 PDS suture.  An additional figure-of-eight was required at the left margin for complete closure.  The subcutaneous tissue was approximated with running 2-0 plain.  The skin was closed with wide skin staples.  The patient tolerated the operative procedure well, was awakened, taken to the recovery room in good condition.     Freddy Finner, M.D.     WRN/MEDQ  D:  09/11/2011  T:  09/11/2011  Job:  [id number]

## 2011-09-11 NOTE — Anesthesia Procedure Notes (Signed)
Procedure Name: Intubation Date/Time: 09/11/2011 7:41 AM Performed by: Harriett Rush, Simisola Sandles A Pre-anesthesia Checklist: Patient identified, Patient being monitored, Emergency Drugs available, Timeout performed and Suction available Patient Re-evaluated:Patient Re-evaluated prior to inductionOxygen Delivery Method: Circle system utilized Preoxygenation: Pre-oxygenation with 100% oxygen Intubation Type: IV induction Ventilation: Mask ventilation without difficulty Laryngoscope Size: Miller and 2 Grade View: Grade III Tube type: Oral Tube size: 7.0 mm Number of attempts: 2 (!st attempt stomach, immediately recognized and removed) Placement Confirmation: ETT inserted through vocal cords under direct vision,  breath sounds checked- equal and bilateral and positive ETCO2 Secured at: 22 cm Tube secured with: Tape Dental Injury: Teeth and Oropharynx as per pre-operative assessment

## 2011-09-11 NOTE — Transfer of Care (Signed)
Immediate Anesthesia Transfer of Care Note  Patient: [name]  Procedure(s) Performed: Procedure(s) (LRB): HYSTERECTOMY ABDOMINAL (N/A) SALPINGO OOPHERECTOMY (Bilateral)  Patient Location: PACU  Anesthesia Type: General  Level of Consciousness: awake, alert  and oriented  Airway & Oxygen Therapy: Patient Spontanous Breathing and Patient connected to nasal cannula oxygen  Post-op Assessment: Report given to PACU RN, Post -op Vital signs reviewed and stable and Patient moving all extremities X 4  Post vital signs: Reviewed and stable  Complications: No apparent anesthesia complications and Patient re-intubated

## 2011-09-11 NOTE — Anesthesia Preprocedure Evaluation (Addendum)
Anesthesia Evaluation  Patient identified by MRN, date of birth, ID band Patient awake    Reviewed: Allergy & Precautions, H&P , Patient's Chart, lab work & pertinent test results, reviewed documented beta blocker date and time   Airway Mallampati: II TM Distance: >3 FB Neck ROM: full    Dental No notable dental hx.    Pulmonary  breath sounds clear to auscultation  Pulmonary exam normal       Cardiovascular hypertension (DBP's 90's here but 70's a few days ago.), Pt. on medications Rhythm:regular Rate:Normal     Neuro/Psych TIA (in 2011)   GI/Hepatic   Endo/Other    Renal/GU      Musculoskeletal   Abdominal   Peds  Hematology   Anesthesia Other Findings   Reproductive/Obstetrics                           Anesthesia Physical Anesthesia Plan  ASA: II  Anesthesia Plan: General   Post-op Pain Management:    Induction: Intravenous  Airway Management Planned: Oral ETT  Additional Equipment:   Intra-op Plan:   Post-operative Plan:   Informed Consent: I have reviewed the patients History and Physical, chart, labs and discussed the procedure including the risks, benefits and alternatives for the proposed anesthesia with the patient or authorized representative who has indicated his/her understanding and acceptance.   Dental Advisory Given  Plan Discussed with: CRNA and Surgeon  Anesthesia Plan Comments: (  Discussed  general anesthesia, including possible nausea, instrumentation of airway, sore throat,pulmonary aspiration, etc. I asked if the were any outstanding questions, or  concerns before we proceeded. )       Anesthesia Quick Evaluation

## 2011-09-12 ENCOUNTER — Encounter (HOSPITAL_COMMUNITY): Payer: Self-pay

## 2011-09-12 LAB — CBC
HCT: 37.2 % (ref 36.0–46.0)
MCHC: 32 g/dL (ref 30.0–36.0)
Platelets: 399 10*3/uL (ref 150–400)
RDW: 15.7 % — ABNORMAL HIGH (ref 11.5–15.5)
WBC: 13.8 10*3/uL — ABNORMAL HIGH (ref 4.0–10.5)

## 2011-09-12 NOTE — Progress Notes (Signed)
UR Chart review completed.  

## 2011-09-13 NOTE — Discharge Instructions (Signed)
No vaginal entry No lifting No driving

## 2011-09-13 NOTE — Discharge Summary (Signed)
NAMEPRISCILLIA, [name]             ACCOUNT NO.:  [id number]  MEDICAL RECORD NO.:  [id number]  LOCATION:  9303                          FACILITY:  [facility]  PHYSICIAN:  Freddy Finner, M.D.   DATE OF BIRTH:  07-12-1965  DATE OF ADMISSION:  09/11/2011 DATE OF DISCHARGE:  09/13/2011                              DISCHARGE SUMMARY   DISCHARGE DIAGNOSES:  Large uterine leiomyomata, clinical symptoms of menometrorrhagia.  SECONDARY DIAGNOSIS:  Hypertension.  OPERATIVE PROCEDURE:  Total-abdominal hysterectomy, bilateral salpingo- oophorectomy.  INTRAOPERATIVE AND POSTOPERATIVE COMPLICATIONS:  None.  DISPOSITION:  The patient is in satisfactory improved condition at the time of her discharge.  She is tolerating a regular diet.  She is voiding without difficulty.  She is passing gas through the rectum.  Her abdominal incision is intact and healing well.  She is ambulating without assistance.  She is taking a regular diet.  She was given instructions to call for fever or heavy vaginal bleeding.  She is to have progressively increasing physical activity with as much walking as she can tolerate.  She is to take a regular diet.  She is to resume her antihypertensive medication.  She was given Percocet 5/325 to be taken 1 or 2 every 4-6 hours as needed for postoperative pain.  She is to also take ibuprofen 600 mg every 6 hours as needed for pain and can mix this with her Percocet.  She is to return to the office in 48 hours for staple removal.  Details of the present illness, past history, family history, review of systems, and physical exam recorded in the admission note.  Basically, her admission physical exam was remarkable for the marked enlargement of the uterus, which was irregular and nodular and approximately 14-week size.  There were no other physical abnormalities noted except for some elevation of her blood pressure.  This according to her was because she had been off of her blood  pressure medication for 2-3 weeks before restarting immediately before her admission.  Laboratory data during the hospital stay as recorded in the record and will not be repeated.  Her postoperative hemoglobin was in the 11 range, which is appropriate for the blood loss at surgery.  HOSPITAL COURSE:  The patient was admitted on the morning of surgery. She was treated perioperatively with PAS hose with IV antibiotic.  Her procedure was accomplished without difficulty or intraoperative complications.  Estimated blood loss was 300 mL.  The uterus weighed in excess of 500 g.  Postoperatively, there were no complications.  She was ambulating on the day of surgery.  By the morning of the first postoperative day, she was taking a regular diet without difficulty.  By the morning of the second day, she was considered to be in excellent condition.  She was discharged home with further disposition as noted above.  She had remained afebrile throughout her hospital stay.  Her blood pressure was responding to medications.     Freddy Finner, M.D.     WRN/MEDQ  D:  09/13/2011  T:  09/13/2011  Job:  [id number]

## 2011-09-13 NOTE — Progress Notes (Signed)
Tolerating regular diet, ambulating, voiding, good pain control  Blood pressure 114/76, pulse 71, temperature 97.3 F (36.3 C), temperature source Oral, resp. rate 18, height 5\' 2"  (1.575 m), weight 185 lb (83.915 kg), SpO2 98.00%.  Abd soft, good BS Incision healing well  A: satisfactory P:D/C home

## 2011-09-13 NOTE — Progress Notes (Signed)
Pt is discharged in the care of Mother. Downstairs per ambulatory. Abdominal incision is clean and dry Staples are in place. Pt. To go to Dr.s office for staples to be removed. Denies any pain or discomfort. No heavy vaginal bleeding.stable.

## 2011-09-13 NOTE — Discharge Summary (Signed)
Admission  09/11/11 Discharge  09/13/11  Admitting Diagnosis: Uterine Fibroids Procedure: TAH/BSO  09/11/11 Hospital course: Admitted for above surgery. Had good resumption of bowel function, ambulating, passing flatus, good pain control. Labs:   Results for orders placed during the hospital encounter of 09/11/11 (from the past 48 hour(s))  CBC     Status: Abnormal   Collection Time   09/12/11  5:16 AM      Component Value Range Comment   WBC 13.8 (*) 4.0 - 10.5 (K/uL)    RBC 4.59  3.87 - 5.11 (MIL/uL)    Hemoglobin 11.9 (*) 12.0 - 15.0 (g/dL)    HCT 09.8  11.9 - 14.7 (%)    MCV 81.0  78.0 - 100.0 (fL)    MCH 25.9 (*) 26.0 - 34.0 (pg)    MCHC 32.0  30.0 - 36.0 (g/dL)    RDW 82.9 (*) 56.2 - 15.5 (%)    Platelets 399  150 - 400 (K/uL)   Follow up: 2 weeks in office Meds: Percocet 5/325 as directed, ibuprofen as directed Instructions: No vaginal entry, no driving, no lifting.  Call for temperature 101 degrees, persistent N/V, increasing pain or heavy vaginal bleeding.

## 2012-09-18 ENCOUNTER — Other Ambulatory Visit: Payer: Self-pay | Admitting: Internal Medicine

## 2012-09-23 ENCOUNTER — Other Ambulatory Visit (INDEPENDENT_AMBULATORY_CARE_PROVIDER_SITE_OTHER): Payer: BC Managed Care – PPO

## 2012-09-23 ENCOUNTER — Encounter: Payer: Self-pay | Admitting: Internal Medicine

## 2012-09-23 ENCOUNTER — Ambulatory Visit (INDEPENDENT_AMBULATORY_CARE_PROVIDER_SITE_OTHER): Payer: BC Managed Care – PPO | Admitting: Internal Medicine

## 2012-09-23 ENCOUNTER — Other Ambulatory Visit: Payer: Self-pay | Admitting: Internal Medicine

## 2012-09-23 VITALS — BP 140/90 | HR 68 | Temp 98.7°F | Resp 16 | Ht 62.0 in | Wt 194.5 lb

## 2012-09-23 DIAGNOSIS — G459 Transient cerebral ischemic attack, unspecified: Secondary | ICD-10-CM

## 2012-09-23 DIAGNOSIS — I10 Essential (primary) hypertension: Secondary | ICD-10-CM

## 2012-09-23 DIAGNOSIS — E785 Hyperlipidemia, unspecified: Secondary | ICD-10-CM | POA: Insufficient documentation

## 2012-09-23 LAB — URINALYSIS, ROUTINE W REFLEX MICROSCOPIC
Ketones, ur: NEGATIVE
Leukocytes, UA: NEGATIVE
Specific Gravity, Urine: 1.02 (ref 1.000–1.030)
Urobilinogen, UA: 0.2 (ref 0.0–1.0)

## 2012-09-23 LAB — CBC WITH DIFFERENTIAL/PLATELET
Basophils Relative: 0.3 % (ref 0.0–3.0)
Eosinophils Absolute: 0.1 10*3/uL (ref 0.0–0.7)
Eosinophils Relative: 1.3 % (ref 0.0–5.0)
HCT: 37.9 % (ref 36.0–46.0)
Hemoglobin: 12.4 g/dL (ref 12.0–15.0)
Lymphs Abs: 2.3 10*3/uL (ref 0.7–4.0)
MCHC: 32.8 g/dL (ref 30.0–36.0)
MCV: 78.8 fl (ref 78.0–100.0)
Monocytes Absolute: 0.6 10*3/uL (ref 0.1–1.0)
Neutro Abs: 7.3 10*3/uL (ref 1.4–7.7)
Neutrophils Relative %: 70.5 % (ref 43.0–77.0)
RBC: 4.81 Mil/uL (ref 3.87–5.11)
WBC: 10.4 10*3/uL (ref 4.5–10.5)

## 2012-09-23 LAB — LIPID PANEL
Cholesterol: 184 mg/dL (ref 0–200)
HDL: 60.1 mg/dL (ref >39.00)
LDL Cholesterol: 118 mg/dL — ABNORMAL HIGH (ref 0–99)
VLDL: 6.2 mg/dL (ref 0.0–40.0)

## 2012-09-23 LAB — COMPREHENSIVE METABOLIC PANEL
AST: 18 U/L (ref 0–37)
Albumin: 3.3 g/dL — ABNORMAL LOW (ref 3.5–5.2)
Alkaline Phosphatase: 76 U/L (ref 39–117)
BUN: 20 mg/dL (ref 6–23)
Creatinine, Ser: 1.2 mg/dL (ref 0.4–1.2)
Glucose, Bld: 92 mg/dL (ref 70–99)
Potassium: 4.1 mEq/L (ref 3.5–5.1)
Total Bilirubin: 0.3 mg/dL (ref 0.3–1.2)

## 2012-09-23 LAB — TSH: TSH: 1.08 u[IU]/mL (ref 0.35–5.50)

## 2012-09-23 MED ORDER — ASPIRIN 81 MG PO TABS: 160.0000 mg | ORAL_TABLET | Freq: Every day | ORAL | Status: AC

## 2012-09-23 MED ORDER — ROSUVASTATIN CALCIUM 10 MG PO TABS: 10.0000 mg | ORAL_TABLET | Freq: Every day | ORAL | Status: AC

## 2012-09-23 MED ORDER — OLMESARTAN-AMLODIPINE-HCTZ 40-5-12.5 MG PO TABS: 1.0000 | ORAL_TABLET | Freq: Every day | ORAL | Status: DC

## 2012-09-23 NOTE — Progress Notes (Signed)
Subjective:    Patient ID: [name], female    DOB: Sep 21, 1965, 47 y.o.   MRN: [id number]  Hypertension This is a chronic problem. The current episode started more than 1 year ago. The problem has been gradually improving since onset. The problem is controlled. Pertinent negatives include no anxiety, blurred vision, chest pain, headaches, malaise/fatigue, neck pain, orthopnea, palpitations, peripheral edema, PND, shortness of breath or sweats. Past treatments include angiotensin blockers, calcium channel blockers and diuretics. The current treatment provides moderate improvement. Compliance problems include exercise and diet.  Hypertensive end-organ damage includes CVA.      Review of Systems  Constitutional: Positive for unexpected weight change (some weight gain). Negative for fever, chills, malaise/fatigue, diaphoresis, activity change, appetite change and fatigue.  HENT: Negative.  Negative for neck pain.   Eyes: Negative.  Negative for blurred vision.  Respiratory: Negative.  Negative for cough, choking, chest tightness, shortness of breath, wheezing and stridor.   Cardiovascular: Negative.  Negative for chest pain, palpitations, orthopnea, leg swelling and PND.  Gastrointestinal: Negative.  Negative for nausea, vomiting, abdominal pain, diarrhea and constipation.  Endocrine: Negative.   Genitourinary: Negative.   Musculoskeletal: Negative.   Skin: Negative.   Allergic/Immunologic: Negative.   Neurological: Negative.  Negative for dizziness, weakness, light-headedness, numbness and headaches.  Hematological: Negative.  Negative for adenopathy. Does not bruise/bleed easily.  Psychiatric/Behavioral: Negative.        Objective:   Physical Exam  Vitals reviewed. Constitutional: She is oriented to person, place, and time. She appears well-developed and well-nourished. No distress.  HENT:  Head: Normocephalic and atraumatic.  Mouth/Throat: Oropharynx is clear and moist. No  oropharyngeal exudate.  Eyes: Conjunctivae are normal. Right eye exhibits no discharge. Left eye exhibits no discharge. No scleral icterus.  Neck: Normal range of motion. Neck supple. No JVD present. No tracheal deviation present. No thyromegaly present.  Cardiovascular: Normal rate, regular rhythm, normal heart sounds and intact distal pulses.  Exam reveals no gallop and no friction rub.   No murmur heard. Pulmonary/Chest: Effort normal and breath sounds normal. No stridor. No respiratory distress. She has no wheezes. She has no rales. She exhibits no tenderness.  Abdominal: Soft. Bowel sounds are normal. She exhibits no distension and no mass. There is no tenderness. There is no rebound and no guarding.  Musculoskeletal: Normal range of motion. She exhibits no edema and no tenderness.  Lymphadenopathy:    She has no cervical adenopathy.  Neurological: She is oriented to person, place, and time.  Skin: Skin is warm and dry. No rash noted. She is not diaphoretic. No erythema. No pallor.  Psychiatric: She has a normal mood and affect. Her behavior is normal. Judgment and thought content normal.     Lab Results  Component Value Date   WBC 13.8* 09/12/2011   HGB 11.9* 09/12/2011   HCT 37.2 09/12/2011   PLT 399 09/12/2011   GLUCOSE 88 09/08/2011   CHOL 217* 05/31/2011   TRIG 25.0 05/31/2011   HDL 73.50 05/31/2011   LDLDIRECT 131.1 05/31/2011   LDLCALC  Value: 127        Total Cholesterol/HDL:CHD Risk Coronary Heart Disease Risk Table                     Men   Women  1/2 Average Risk   3.4   3.3  Average Risk       5.0   4.4  2 X Average Risk   9.6   7.1  3 X Average Risk  23.4   11.0        Use the calculated Patient Ratio above and the CHD Risk Table to determine the patient's CHD Risk.        ATP III CLASSIFICATION (LDL):  <100     mg/dL   Optimal  161-096  mg/dL   Near or Above                    Optimal  130-159  mg/dL   Borderline  045-409  mg/dL   High  >811     mg/dL   Very High* 91/47/8295   ALT  15 05/31/2011   AST 18 05/31/2011   NA 136 09/08/2011   K 3.9 09/08/2011   CL 99 09/08/2011   CREATININE 1.20* 09/08/2011   BUN 20 09/08/2011   CO2 27 09/08/2011   TSH 1.22 05/31/2011   HGBA1C 5.7 05/31/2011       Assessment & Plan:

## 2012-09-23 NOTE — Patient Instructions (Signed)

## 2012-09-23 NOTE — Assessment & Plan Note (Signed)
Will work on risk modification - she will restart crestor, will improve lifestyle modifications, will continue asa

## 2012-09-23 NOTE — Assessment & Plan Note (Signed)
She will work on lifestyle modifications For now, she has adequate BP control Today I will check her lytes and renal function

## 2012-09-23 NOTE — Assessment & Plan Note (Signed)
I will check her FLP CMP TSH today I have asked her to restart crestor

## 2012-09-25 ENCOUNTER — Telehealth: Payer: Self-pay | Admitting: *Deleted

## 2012-09-25 DIAGNOSIS — I10 Essential (primary) hypertension: Secondary | ICD-10-CM

## 2012-09-25 MED ORDER — LOSARTAN POTASSIUM-HCTZ 100-12.5 MG PO TABS: 1.0000 | ORAL_TABLET | Freq: Every day | ORAL | Status: DC

## 2012-09-25 MED ORDER — AMLODIPINE BESYLATE 5 MG PO TABS: 5.0000 mg | ORAL_TABLET | Freq: Every day | ORAL | Status: DC

## 2012-09-25 NOTE — Telephone Encounter (Signed)
Pt would like alternative medication for Tribenzor 40mg . She cannot afford because cost has doubled.

## 2012-09-25 NOTE — Telephone Encounter (Signed)
changed

## 2012-09-25 NOTE — Telephone Encounter (Signed)
Pt informed of new rx's for BP.

## 2013-05-28 ENCOUNTER — Ambulatory Visit: Payer: BC Managed Care – PPO | Admitting: Internal Medicine

## 2013-10-07 ENCOUNTER — Ambulatory Visit: Payer: Self-pay

## 2013-12-14 ENCOUNTER — Other Ambulatory Visit: Payer: Self-pay | Admitting: Internal Medicine

## 2014-01-19 ENCOUNTER — Encounter: Payer: BC Managed Care – PPO | Admitting: Internal Medicine

## 2014-01-20 ENCOUNTER — Telehealth: Payer: Self-pay | Admitting: Internal Medicine

## 2014-01-20 DIAGNOSIS — I10 Essential (primary) hypertension: Secondary | ICD-10-CM

## 2014-01-20 NOTE — Telephone Encounter (Signed)
Pt states that she has started a new job and her new insurance will not start in 90 days. Pt wants to request refills on BENICAR but wants an inexpensive and affordable alternative until her insurance kicks in. Please contact pt and advise when this request is reviewed. Pt has been informed of new sample/voucher policy and has been made aware that there is a possibility that we don't have those available.

## 2014-01-21 MED ORDER — AMLODIPINE BESYLATE 5 MG PO TABS: 5.0000 mg | ORAL_TABLET | Freq: Every day | ORAL | Status: DC

## 2014-01-21 NOTE — Telephone Encounter (Signed)
Pt is allergic to ACEI  I am assuming she has been on benicar samples ? per Dr Ronnald Ramp (does unknown)  Med list shows losartan-HCT and amlodipine but has expired, and would likely be not be affordable as pt indicates without insurance coverage (even though generic)  OK to restart the amlodipine 5 mg per day (can be obtained at [facility] for $5/mo)  Done hardcopy to robin, but can be sent to [facility] if pt want this  If any of these assumptions are incorrect, please let me know

## 2014-01-21 NOTE — Telephone Encounter (Signed)
Called the patient informed of  MD instructions.  The patient has a friend that will pickup hardcopy at the front desk tomorrow.

## 2014-01-21 NOTE — Telephone Encounter (Signed)
Called left message to call back 

## 2014-01-28 ENCOUNTER — Ambulatory Visit: Payer: BC Managed Care – PPO | Admitting: Internal Medicine

## 2014-01-28 DIAGNOSIS — Z0289 Encounter for other administrative examinations: Secondary | ICD-10-CM

## 2014-05-22 ENCOUNTER — Other Ambulatory Visit: Payer: Self-pay

## 2014-05-22 DIAGNOSIS — Z1231 Encounter for screening mammogram for malignant neoplasm of breast: Secondary | ICD-10-CM

## 2014-06-05 ENCOUNTER — Ambulatory Visit (INDEPENDENT_AMBULATORY_CARE_PROVIDER_SITE_OTHER): Payer: BLUE CROSS/BLUE SHIELD | Admitting: Family

## 2014-06-05 ENCOUNTER — Other Ambulatory Visit (INDEPENDENT_AMBULATORY_CARE_PROVIDER_SITE_OTHER): Payer: BLUE CROSS/BLUE SHIELD

## 2014-06-05 ENCOUNTER — Encounter: Payer: Self-pay | Admitting: Family

## 2014-06-05 ENCOUNTER — Telehealth: Payer: Self-pay | Admitting: Internal Medicine

## 2014-06-05 VITALS — BP 220/160 | HR 72 | Temp 98.8°F | Resp 18 | Ht 62.0 in | Wt 182.8 lb

## 2014-06-05 DIAGNOSIS — I10 Essential (primary) hypertension: Secondary | ICD-10-CM

## 2014-06-05 LAB — BASIC METABOLIC PANEL
BUN: 19 mg/dL (ref 6–23)
CHLORIDE: 106 meq/L (ref 96–112)
CO2: 32 meq/L (ref 19–32)
Calcium: 9.1 mg/dL (ref 8.4–10.5)
Creatinine, Ser: 1.57 mg/dL — ABNORMAL HIGH (ref 0.40–1.20)
GFR: 45.11 mL/min — AB (ref >60.00)
GLUCOSE: 90 mg/dL (ref 70–99)
POTASSIUM: 3.3 meq/L — AB (ref 3.5–5.1)
SODIUM: 142 meq/L (ref 135–145)

## 2014-06-05 MED ORDER — LOSARTAN POTASSIUM-HCTZ 100-12.5 MG PO TABS: 1.0000 | ORAL_TABLET | Freq: Every day | ORAL | Status: DC

## 2014-06-05 NOTE — Telephone Encounter (Signed)
Pt requesting to change from Dr Ronnald Ramp to [place]. Is this ok

## 2014-06-05 NOTE — Progress Notes (Signed)
   Subjective:    Patient ID: [name], female    DOB: [birth year], 49 y.o.   MRN: [id number]   Chief Complaint  Patient presents with  . Follow-up    hypertension, has not had medicine in over a year     HPI:  [name] is a 49 y.o. female who presents today for a follow up on her blood pressure.   Indicates that she has not had insurance for over a year and has not been able to obtain her medication. She did have 1 adverse event where she was seen at its [facility] for numbness in one of her arms. Diagnostic EKG was negative and she was placed on amlodipine. She took her amlodipine within the last hour. Her last eye exam was in 2014 and she does not currently have vision insurance.   BP Readings from Last 3 Encounters:  06/05/14 220/160  09/23/12 140/90  09/13/11 114/76    Allergies  Allergen Reactions  . Lisinopril     cough  . Tetanus Toxoid Hives and Rash    Current Outpatient Prescriptions on File Prior to Visit  Medication Sig Dispense Refill  . acetaminophen (TYLENOL) 500 MG tablet Take 500-1,000 mg by mouth as needed.    Marland Kitchen amLODipine (NORVASC) 5 MG tablet Take 1 tablet (5 mg total) by mouth daily. 90 tablet 1  . aspirin 81 MG tablet Take 2 tablets (162 mg total) by mouth daily. 90 tablet 3  . losartan-hydrochlorothiazide (HYZAAR) 100-12.5 MG per tablet Take 1 tablet by mouth daily. 90 tablet 3   No current facility-administered medications on file prior to visit.    Review of Systems  Eyes:       Negative for vision change  Respiratory: Negative for chest tightness.   Cardiovascular: Negative for chest pain, palpitations and leg swelling.      Objective:   .BP 220/160 mmHg  Pulse 72  Temp(Src) 98.8 F (37.1 C) (Oral)  Resp 18  Ht 5\' 2"  (1.575 m)  Wt 182 lb 12.8 oz (82.918 kg)  BMI 33.43 kg/m2  SpO2 96%  LMP 09/07/2011 Nursing note and vital signs reviewed.   Physical Exam  Constitutional: She is oriented to person, place, and  time. She appears well-developed and well-nourished. No distress.  Cardiovascular: Normal rate, regular rhythm, normal heart sounds and intact distal pulses.   Pulmonary/Chest: Effort normal and breath sounds normal.  Neurological: She is alert and oriented to person, place, and time.  Skin: Skin is warm and dry.  Psychiatric: She has a normal mood and affect. Her behavior is normal. Judgment and thought content normal.       Assessment & Plan:

## 2014-06-05 NOTE — Progress Notes (Signed)
Pre visit review using our clinic review tool, if applicable. No additional management support is needed unless otherwise documented below in the visit note. 

## 2014-06-05 NOTE — Patient Instructions (Addendum)
Thank you for choosing [facility].  Summary/Instructions:  Your prescription(s) have been submitted to your pharmacy or been printed and provided for you. Please take as directed and contact our office if you believe you are having problem(s) with the medication(s) or have any questions.  Please stop by the lab on the basement level of the building for your blood work. Your results will be released to [facility] (or called to you) after review, usually within 72 hours after test completion. If any changes need to be made, you will be notified at that same time.  If your symptoms worsen or fail to improve, please contact our office for further instruction, or in case of emergency go directly to the emergency room at the closest medical facility.    Hypertension Hypertension, commonly called high blood pressure, is when the force of blood pumping through your arteries is too strong. Your arteries are the blood vessels that carry blood from your heart throughout your body. A blood pressure reading consists of a higher number over a lower number, such as 110/72. The higher number (systolic) is the pressure inside your arteries when your heart pumps. The lower number (diastolic) is the pressure inside your arteries when your heart relaxes. Ideally you want your blood pressure below 120/80. Hypertension forces your heart to work harder to pump blood. Your arteries may become narrow or stiff. Having hypertension puts you at risk for heart disease, stroke, and other problems.  RISK FACTORS Some risk factors for high blood pressure are controllable. Others are not.  Risk factors you cannot control include:   Race. You may be at higher risk if you are African American.  Age. Risk increases with age.  Gender. Men are at higher risk than women before age 2 years. After age 54, women are at higher risk than men. Risk factors you can control include:  Not getting enough exercise or physical  activity.  Being overweight.  Getting too much fat, sugar, calories, or salt in your diet.  Drinking too much alcohol. SIGNS AND SYMPTOMS Hypertension does not usually cause signs or symptoms. Extremely high blood pressure (hypertensive crisis) may cause headache, anxiety, shortness of breath, and nosebleed. DIAGNOSIS  To check if you have hypertension, your health care provider will measure your blood pressure while you are seated, with your arm held at the level of your heart. It should be measured at least twice using the same arm. Certain conditions can cause a difference in blood pressure between your right and left arms. A blood pressure reading that is higher than normal on one occasion does not mean that you need treatment. If one blood pressure reading is high, ask your health care provider about having it checked again. TREATMENT  Treating high blood pressure includes making lifestyle changes and possibly taking medicine. Living a healthy lifestyle can help lower high blood pressure. You may need to change some of your habits. Lifestyle changes may include:  Following the DASH diet. This diet is high in fruits, vegetables, and whole grains. It is low in salt, red meat, and added sugars.  Getting at least 2 hours of brisk physical activity every week.  Losing weight if necessary.  Not smoking.  Limiting alcoholic beverages.  Learning ways to reduce stress. If lifestyle changes are not enough to get your blood pressure under control, your health care provider may prescribe medicine. You may need to take more than one. Work closely with your health care provider to understand the risks  and benefits. HOME CARE INSTRUCTIONS  Have your blood pressure rechecked as directed by your health care provider.   Take medicines only as directed by your health care provider. Follow the directions carefully. Blood pressure medicines must be taken as prescribed. The medicine does not work as  well when you skip doses. Skipping doses also puts you at risk for problems.   Do not smoke.   Monitor your blood pressure at home as directed by your health care provider. SEEK MEDICAL CARE IF:   You think you are having a reaction to medicines taken.  You have recurrent headaches or feel dizzy.  You have swelling in your ankles.  You have trouble with your vision. SEEK IMMEDIATE MEDICAL CARE IF:  You develop a severe headache or confusion.  You have unusual weakness, numbness, or feel faint.  You have severe chest or abdominal pain.  You vomit repeatedly.  You have trouble breathing. MAKE SURE YOU:   Understand these instructions.  Will watch your condition.  Will get help right away if you are not doing well or get worse. Document Released: 04/10/2005 Document Revised: 08/25/2013 Document Reviewed: 01/31/2013 [facility] Patient Information 2015 [place], [place]. This information is not intended to replace advice given to you by your health care provider. Make sure you discuss any questions you have with your health care provider.

## 2014-06-05 NOTE — Assessment & Plan Note (Signed)
Blood pressure is uncontrolled and above goal of 140/90. Continue current amlodipine as prescribed. Restart losartan-hydrochlorothiazide. Record blood pressures at home to monitor trends. Follow-up in 2 weeks to ensure adequacy of treatment.

## 2014-06-06 ENCOUNTER — Telehealth: Payer: Self-pay | Admitting: Family

## 2014-06-06 NOTE — Telephone Encounter (Signed)
Please inform the patient that her kidney function exam is adequate, however does show some potential damage to her kidneys. I would like to ensure that she hydrates well for the next couple of weeks and we can retest when we recheck her blood pressure. This may be an anomaly due to dehydration, however only a retest will confirm this.

## 2014-06-07 NOTE — Telephone Encounter (Signed)
yes

## 2014-06-08 NOTE — Telephone Encounter (Signed)
LVM for pt to call back.

## 2014-06-10 NOTE — Telephone Encounter (Signed)
Left another VM for pt to call back.

## 2014-06-15 NOTE — Telephone Encounter (Signed)
Sending pts results in the mail.

## 2014-06-19 ENCOUNTER — Other Ambulatory Visit (INDEPENDENT_AMBULATORY_CARE_PROVIDER_SITE_OTHER): Payer: BLUE CROSS/BLUE SHIELD

## 2014-06-19 ENCOUNTER — Encounter: Payer: Self-pay | Admitting: Family

## 2014-06-19 ENCOUNTER — Telehealth: Payer: Self-pay | Admitting: Family

## 2014-06-19 ENCOUNTER — Ambulatory Visit (INDEPENDENT_AMBULATORY_CARE_PROVIDER_SITE_OTHER): Payer: BLUE CROSS/BLUE SHIELD | Admitting: Family

## 2014-06-19 VITALS — BP 168/98 | HR 81 | Temp 98.2°F | Wt 180.0 lb

## 2014-06-19 DIAGNOSIS — I10 Essential (primary) hypertension: Secondary | ICD-10-CM

## 2014-06-19 LAB — BASIC METABOLIC PANEL
BUN: 19 mg/dL (ref 6–23)
CHLORIDE: 103 meq/L (ref 96–112)
CO2: 30 meq/L (ref 19–32)
CREATININE: 1.48 mg/dL — AB (ref 0.40–1.20)
Calcium: 9.5 mg/dL (ref 8.4–10.5)
GFR: 48.29 mL/min — ABNORMAL LOW (ref >60.00)
Glucose, Bld: 88 mg/dL (ref 70–99)
Potassium: 4 mEq/L (ref 3.5–5.1)
Sodium: 139 mEq/L (ref 135–145)

## 2014-06-19 MED ORDER — AMLODIPINE BESYLATE 10 MG PO TABS: 10.0000 mg | ORAL_TABLET | Freq: Every day | ORAL | Status: DC

## 2014-06-19 NOTE — Patient Instructions (Signed)
Thank you for choosing [facility].  Summary/Instructions:  Please take 2 - 5 mg tablets of amlodipine until completed and then 1 when you pick up your new prescription.  Please continue to take the losartan-HCTZ as prescribed.   Your prescription(s) have been submitted to your pharmacy or been printed and provided for you. Please take as directed and contact our office if you believe you are having problem(s) with the medication(s) or have any questions.  Please stop by the lab on the basement level of the building for your blood work. Your results will be released to [place] (or called to you) after review, usually within 72 hours after test completion. If any changes need to be made, you will be notified at that same time.

## 2014-06-19 NOTE — Assessment & Plan Note (Signed)
Blood pressure today remains slightly elevated from goal. Continue losartan/hydrochlorothiazide as previously prescribed. Increase amlodipine to 10 mg daily. Recheck basic metabolic panel to confirm kidney function. Follow-up in one month or sooner if needed.

## 2014-06-19 NOTE — Progress Notes (Signed)
Pre visit review using our clinic review tool, if applicable. No additional management support is needed unless otherwise documented below in the visit note. 

## 2014-06-19 NOTE — Progress Notes (Signed)
   Subjective:    Patient ID: [name], female    DOB: 1965-07-09, 49 y.o.   MRN: [id number]  Chief Complaint  Patient presents with  . Hypertension    HPI:  [name] is a 49 y.o. female who presents today for follow up.  Was recently seen for blood pressure about 2 weeks ago when she restarted her Hyzaar and amlodipine. Indicates her home blood pressure readings at night average systolic 626 and has been taking her medications as prescribed.  Denies any associated symptoms of end organ damage.    Allergies  Allergen Reactions  . Lisinopril     cough  . Tetanus Toxoid Hives and Rash    Current Outpatient Prescriptions on File Prior to Visit  Medication Sig Dispense Refill  . acetaminophen (TYLENOL) 500 MG tablet Take 500-1,000 mg by mouth as needed.    Marland Kitchen amLODipine (NORVASC) 5 MG tablet Take 1 tablet (5 mg total) by mouth daily. 90 tablet 1  . aspirin 81 MG tablet Take 2 tablets (162 mg total) by mouth daily. 90 tablet 3  . losartan-hydrochlorothiazide (HYZAAR) 100-12.5 MG per tablet Take 1 tablet by mouth daily. 90 tablet 3   No current facility-administered medications on file prior to visit.    Review of Systems  Eyes:       Negative for vision change  Respiratory: Negative for chest tightness.   Cardiovascular: Negative for chest pain, palpitations and leg swelling.      Objective:    BP 168/98 mmHg  Pulse 81  Temp(Src) 98.2 F (36.8 C) (Oral)  Wt 180 lb (81.647 kg)  SpO2 97%  LMP 09/07/2011 Nursing note and vital signs reviewed.  Physical Exam  Constitutional: She is oriented to person, place, and time. She appears well-developed and well-nourished. No distress.  Cardiovascular: Normal rate, regular rhythm, normal heart sounds and intact distal pulses.   Pulmonary/Chest: Effort normal and breath sounds normal.  Neurological: She is alert and oriented to person, place, and time.  Skin: Skin is warm and dry.  Psychiatric: She has a normal mood  and affect. Her behavior is normal. Judgment and thought content normal.       Assessment & Plan:

## 2014-06-22 NOTE — Telephone Encounter (Signed)
Please inform the patient that her lab work confirms the previous level of kidney function. Like previously said, the goal is control her blood pressure to prevent further damage.

## 2014-06-22 NOTE — Telephone Encounter (Signed)
Pt aware of results 

## 2014-06-23 ENCOUNTER — Telehealth: Payer: Self-pay | Admitting: Family

## 2014-06-23 NOTE — Telephone Encounter (Signed)
emmi mailed  °

## 2014-07-24 ENCOUNTER — Ambulatory Visit (INDEPENDENT_AMBULATORY_CARE_PROVIDER_SITE_OTHER): Payer: BLUE CROSS/BLUE SHIELD | Admitting: Family

## 2014-07-24 ENCOUNTER — Telehealth: Payer: Self-pay | Admitting: Family

## 2014-07-24 ENCOUNTER — Encounter: Payer: Self-pay | Admitting: Family

## 2014-07-24 VITALS — BP 160/100 | HR 74 | Temp 98.7°F | Ht 62.0 in | Wt 185.0 lb

## 2014-07-24 DIAGNOSIS — I10 Essential (primary) hypertension: Secondary | ICD-10-CM

## 2014-07-24 MED ORDER — HYDROCHLOROTHIAZIDE 12.5 MG PO CAPS: 12.5000 mg | ORAL_CAPSULE | Freq: Every day | ORAL | Status: DC

## 2014-07-24 NOTE — Patient Instructions (Signed)
Thank you for choosing [facility].  Summary/Instructions:  Please start taking the HCTZ 12.5 mg daily with the losartan-HCTZ until you complete the current prescription of losartan-HCTZ to move up to the next level.   Your prescription(s) have been submitted to your pharmacy or been printed and provided for you. Please take as directed and contact our office if you believe you are having problem(s) with the medication(s) or have any questions.  If your symptoms worsen or fail to improve, please contact our office for further instruction, or in case of emergency go directly to the emergency room at the closest medical facility.

## 2014-07-24 NOTE — Progress Notes (Signed)
   Subjective:    Patient ID: [name], female    DOB: 02/04/1966, 49 y.o.   MRN: [id number]   Chief Complaint  Patient presents with  . Follow-up  . Hypertension    HPI:  [name] is a 49 y.o. female who presents today for follow up.  1) Blood pressure - Patient was seen about 1 month ago for uncontrolled hypertension. At that time her amlodipine was increased to 10 mg daily and she continued to take her losartan-HCTZ. A decrease in kidney function to CKD Stage III was also confirmed with blood work. Blood pressure at home 150s/80s. Denies any side effects of medication. Also notes she has started walking.    BP Readings from Last 3 Encounters:  07/24/14 160/100  06/19/14 168/98  06/05/14 220/160    Allergies  Allergen Reactions  . Lisinopril     cough  . Tetanus Toxoid Hives and Rash    Current Outpatient Prescriptions on File Prior to Visit  Medication Sig Dispense Refill  . acetaminophen (TYLENOL) 500 MG tablet Take 500-1,000 mg by mouth as needed.    Marland Kitchen amLODipine (NORVASC) 10 MG tablet Take 1 tablet (10 mg total) by mouth daily. 90 tablet 0  . aspirin 81 MG tablet Take 2 tablets (162 mg total) by mouth daily. 90 tablet 3  . losartan-hydrochlorothiazide (HYZAAR) 100-12.5 MG per tablet Take 1 tablet by mouth daily. 90 tablet 3   No current facility-administered medications on file prior to visit.    Review of Systems  Eyes:       Negative for changes in vision.  Respiratory: Negative for chest tightness.   Cardiovascular: Negative for chest pain, palpitations and leg swelling.  Neurological: Negative for headaches.      Objective:    BP 160/100 mmHg  Pulse 74  Temp(Src) 98.7 F (37.1 C) (Oral)  Ht 5\' 2"  (1.575 m)  Wt 185 lb (83.915 kg)  BMI 33.83 kg/m2  SpO2 97%  LMP 09/07/2011 Nursing note and vital signs reviewed.  Physical Exam  Constitutional: She is oriented to person, place, and time. She appears well-developed and well-nourished.  No distress.  Cardiovascular: Normal rate, regular rhythm, normal heart sounds and intact distal pulses.   Pulmonary/Chest: Effort normal and breath sounds normal.  Neurological: She is alert and oriented to person, place, and time.  Skin: Skin is warm and dry.  Psychiatric: She has a normal mood and affect. Her behavior is normal. Judgment and thought content normal.       Assessment & Plan:

## 2014-07-24 NOTE — Telephone Encounter (Signed)
emmi emailed °

## 2014-07-24 NOTE — Progress Notes (Signed)
Pre visit review using our clinic review tool, if applicable. No additional management support is needed unless otherwise documented below in the visit note. 

## 2014-07-24 NOTE — Assessment & Plan Note (Signed)
Blood pressure is improved with current regimen, however still above goal of 140/90. Given patient's recent refill of losartan-hydrochlorothiazide, start hydrochlorothiazide 12.5 mg until ready for refill. Then increase losartan-hydrochlorothiazide to 100-25. Continue to monitor blood pressures at home. Follow-up in 3 months or sooner.

## 2014-09-23 ENCOUNTER — Other Ambulatory Visit: Payer: Self-pay | Admitting: Family

## 2014-09-30 ENCOUNTER — Telehealth: Payer: Self-pay | Admitting: Family

## 2014-09-30 NOTE — Telephone Encounter (Signed)
Patient has some questions regarding the medication that was called in for her

## 2014-10-01 NOTE — Telephone Encounter (Signed)
LVM for pt to call back.

## 2014-10-27 ENCOUNTER — Ambulatory Visit: Payer: BLUE CROSS/BLUE SHIELD | Admitting: Family

## 2015-04-19 ENCOUNTER — Other Ambulatory Visit: Payer: Self-pay | Admitting: Family

## 2015-06-03 ENCOUNTER — Telehealth: Payer: Self-pay | Admitting: Family

## 2015-06-03 NOTE — Telephone Encounter (Signed)
Rec'd from [facility] forward 2 pages to Dr. Elna Breslow

## 2015-06-23 ENCOUNTER — Other Ambulatory Visit: Payer: Self-pay | Admitting: Family

## 2015-07-24 DIAGNOSIS — Z1231 Encounter for screening mammogram for malignant neoplasm of breast: Secondary | ICD-10-CM | POA: Diagnosis not present

## 2015-08-11 DIAGNOSIS — R928 Other abnormal and inconclusive findings on diagnostic imaging of breast: Secondary | ICD-10-CM | POA: Diagnosis not present

## 2015-08-11 LAB — HM MAMMOGRAPHY

## 2015-08-16 ENCOUNTER — Encounter: Payer: Self-pay | Admitting: Family

## 2015-08-18 DIAGNOSIS — I10 Essential (primary) hypertension: Secondary | ICD-10-CM | POA: Diagnosis not present

## 2015-08-18 DIAGNOSIS — E785 Hyperlipidemia, unspecified: Secondary | ICD-10-CM | POA: Diagnosis not present

## 2015-08-18 DIAGNOSIS — Z0000 Encounter for general adult medical examination without abnormal findings: Secondary | ICD-10-CM | POA: Diagnosis not present

## 2015-08-23 DIAGNOSIS — D509 Iron deficiency anemia, unspecified: Secondary | ICD-10-CM | POA: Diagnosis not present

## 2015-08-23 DIAGNOSIS — R7989 Other specified abnormal findings of blood chemistry: Secondary | ICD-10-CM | POA: Diagnosis not present

## 2015-08-23 DIAGNOSIS — I10 Essential (primary) hypertension: Secondary | ICD-10-CM | POA: Diagnosis not present

## 2015-09-04 ENCOUNTER — Other Ambulatory Visit: Payer: Self-pay | Admitting: Family

## 2015-09-28 DIAGNOSIS — N200 Calculus of kidney: Secondary | ICD-10-CM | POA: Diagnosis not present

## 2015-10-03 ENCOUNTER — Other Ambulatory Visit: Payer: Self-pay | Admitting: Family

## 2015-10-29 ENCOUNTER — Other Ambulatory Visit: Payer: Self-pay | Admitting: Family

## 2016-01-05 ENCOUNTER — Ambulatory Visit (INDEPENDENT_AMBULATORY_CARE_PROVIDER_SITE_OTHER): Payer: BLUE CROSS/BLUE SHIELD | Admitting: Family

## 2016-01-05 ENCOUNTER — Encounter: Payer: Self-pay | Admitting: Family

## 2016-01-05 ENCOUNTER — Other Ambulatory Visit (INDEPENDENT_AMBULATORY_CARE_PROVIDER_SITE_OTHER): Payer: BLUE CROSS/BLUE SHIELD

## 2016-01-05 VITALS — BP 162/100 | HR 63 | Temp 98.1°F | Resp 16 | Ht 62.0 in | Wt 190.0 lb

## 2016-01-05 DIAGNOSIS — I10 Essential (primary) hypertension: Secondary | ICD-10-CM

## 2016-01-05 LAB — COMPREHENSIVE METABOLIC PANEL
ALBUMIN: 3.8 g/dL (ref 3.5–5.2)
ALT: 9 U/L (ref 0–35)
AST: 13 U/L (ref 0–37)
Alkaline Phosphatase: 73 U/L (ref 39–117)
BUN: 26 mg/dL — ABNORMAL HIGH (ref 6–23)
CALCIUM: 9.1 mg/dL (ref 8.4–10.5)
CHLORIDE: 104 meq/L (ref 96–112)
CO2: 27 mEq/L (ref 19–32)
CREATININE: 1.67 mg/dL — AB (ref 0.40–1.20)
GFR: 41.74 mL/min — AB (ref >60.00)
Glucose, Bld: 78 mg/dL (ref 70–99)
POTASSIUM: 3.9 meq/L (ref 3.5–5.1)
Sodium: 140 mEq/L (ref 135–145)
Total Bilirubin: 0.4 mg/dL (ref 0.2–1.2)
Total Protein: 7.8 g/dL (ref 6.0–8.3)

## 2016-01-05 MED ORDER — AMLODIPINE BESYLATE 10 MG PO TABS: 10.0000 mg | ORAL_TABLET | Freq: Every day | ORAL | 0 refills | Status: DC

## 2016-01-05 MED ORDER — LOSARTAN POTASSIUM-HCTZ 100-25 MG PO TABS: 1.0000 | ORAL_TABLET | Freq: Every day | ORAL | 0 refills | Status: DC

## 2016-01-05 NOTE — Progress Notes (Signed)
Subjective:    Patient ID: [name], female    DOB: July 10, 1965, 50 y.o.   MRN: [id number]  Chief Complaint  Patient presents with  . Hypertension    medication refill of amlodipine, needed an OV    HPI:  [name] is a 50 y.o. female who  has a past medical history of Anemia; Hyperlipidemia; Hypertension; and TIA (transient ischemic attack) (03/2011). and presents today for an office visit.   1.) Hypertension -  Currently maintained on losartan-hydrochlorothiazide and amlodipine. Notes she has not had her amlodipine for approximately 2 weeks. Does continue to take the losartan-hydrochlorothiazide as prescribed. Denies adverse side effects or hypotensive readings. He has a mild headache today but denies worse headache of life. Notes when she takes her blood pressure medications and monitor blood pressure at home is below goal. Working to decrease sodium in diet.    BP Readings from Last 3 Encounters:  01/05/16 (!) 162/100  07/24/14 (!) 160/100  06/19/14 (!) 168/98      Allergies  Allergen Reactions  . Lisinopril     cough  . Tetanus Toxoid Hives and Rash    Outpatient Medications Prior to Visit  Medication Sig Dispense Refill  . acetaminophen (TYLENOL) 500 MG tablet Take 500-1,000 mg by mouth as needed.    Marland Kitchen aspirin 81 MG tablet Take 2 tablets (162 mg total) by mouth daily. 90 tablet 3  . amLODipine (NORVASC) 10 MG tablet TAKE 1 TABLET BY MOUTH EVERY DAY 30 tablet 0  . hydrochlorothiazide (MICROZIDE) 12.5 MG capsule TAKE ONE CAPSULE BY MOUTH EVERY DAY 30 capsule 1  . losartan-hydrochlorothiazide (HYZAAR) 100-12.5 MG tablet TAKE 1 TABLET BY MOUTH EVERY DAY (DUE FOR YEARLY PHYSICAL IN APRIL 2017) 90 tablet 0   No facility-administered medications prior to visit.     Review of Systems  Constitutional: Negative for chills and fever.  Eyes:       Negative for changes in vision  Respiratory: Negative for cough, chest tightness and wheezing.   Cardiovascular:  Negative for chest pain, palpitations and leg swelling.  Neurological: Positive for headaches. Negative for dizziness, weakness and light-headedness.      Objective:    BP (!) 162/100 (BP Location: Left Arm, Patient Position: Sitting, Cuff Size: Large)   Pulse 63   Temp 98.1 F (36.7 C) (Oral)   Resp 16   Ht 5\' 2"  (1.575 m)   Wt 190 lb (86.2 kg)   LMP 09/07/2011   SpO2 97%   BMI 34.75 kg/m  Nursing note and vital signs reviewed.  Physical Exam  Constitutional: She is oriented to person, place, and time. She appears well-developed and well-nourished. No distress.  Cardiovascular: Normal rate, regular rhythm, normal heart sounds and intact distal pulses.   Pulmonary/Chest: Effort normal and breath sounds normal.  Neurological: She is alert and oriented to person, place, and time.  Skin: Skin is warm and dry.  Psychiatric: She has a normal mood and affect. Her behavior is normal. Judgment and thought content normal.       Assessment & Plan:   Problem List Items Addressed This Visit      Cardiovascular and Mediastinum   Essential hypertension, benign - Primary    Blood pressure remains uncontrolled and above goal 140/90 without amlodipine. Does have a mild headache but denies worse headache of life. Previously noted to have decreased kidney function and CK D stage III. Obtain complete metabolic profile to check electrolytes and kidney function. Increase losartan-hydrochlorothiazide. Refill amlodipine.  Encouraged to follow low-sodium diet. Encouraged weight loss. Monitor blood pressure at home. Follow-up in 2-3 weeks for blood pressure check pending blood work.      Relevant Medications   amLODipine (NORVASC) 10 MG tablet   losartan-hydrochlorothiazide (HYZAAR) 100-25 MG tablet   Other Relevant Orders   Comprehensive metabolic panel    Other Visit Diagnoses   None.     I have discontinued Ms. Keding hydrochlorothiazide and losartan-hydrochlorothiazide. I have also  changed her amLODipine. Additionally, I am having her start on losartan-hydrochlorothiazide. Lastly, I am having her maintain her acetaminophen and aspirin.   Meds ordered this encounter  Medications  . amLODipine (NORVASC) 10 MG tablet    Sig: Take 1 tablet (10 mg total) by mouth daily.    Dispense:  90 tablet    Refill:  0    Order Specific Question:   Supervising Provider    Answer:   Pricilla Holm A [id number]  . losartan-hydrochlorothiazide (HYZAAR) 100-25 MG tablet    Sig: Take 1 tablet by mouth daily.    Dispense:  90 tablet    Refill:  0    Order Specific Question:   Supervising Provider    Answer:   Pricilla Holm A [id number]     Follow-up: Return in about 1 month (around 02/04/2016), or if symptoms worsen or fail to improve.  Mauricio Po, FNP

## 2016-01-05 NOTE — Patient Instructions (Signed)
Thank you for choosing [facility].  SUMMARY AND INSTRUCTIONS:  Medication:  Please continue to take your medications as prescribed.   Your prescription(s) have been submitted to your pharmacy or been printed and provided for you. Please take as directed and contact our office if you believe you are having problem(s) with the medication(s) or have any questions.  Follow up:  If your symptoms worsen or fail to improve, please contact our office for further instruction, or in case of emergency go directly to the emergency room at the closest medical facility.     

## 2016-01-05 NOTE — Assessment & Plan Note (Signed)
Blood pressure remains uncontrolled and above goal 140/90 without amlodipine. Does have a mild headache but denies worse headache of life. Previously noted to have decreased kidney function and CK D stage III. Obtain complete metabolic profile to check electrolytes and kidney function. Increase losartan-hydrochlorothiazide. Refill amlodipine. Encouraged to follow low-sodium diet. Encouraged weight loss. Monitor blood pressure at home. Follow-up in 2-3 weeks for blood pressure check pending blood work.

## 2016-01-06 ENCOUNTER — Encounter: Payer: Self-pay | Admitting: Family

## 2016-03-31 ENCOUNTER — Other Ambulatory Visit: Payer: Self-pay | Admitting: Family

## 2016-03-31 DIAGNOSIS — I10 Essential (primary) hypertension: Secondary | ICD-10-CM

## 2016-05-16 DIAGNOSIS — N390 Urinary tract infection, site not specified: Secondary | ICD-10-CM | POA: Diagnosis not present

## 2016-05-16 DIAGNOSIS — I10 Essential (primary) hypertension: Secondary | ICD-10-CM | POA: Diagnosis not present

## 2016-05-16 DIAGNOSIS — R809 Proteinuria, unspecified: Secondary | ICD-10-CM | POA: Diagnosis not present

## 2016-05-30 DIAGNOSIS — I10 Essential (primary) hypertension: Secondary | ICD-10-CM | POA: Diagnosis not present

## 2016-05-30 DIAGNOSIS — R809 Proteinuria, unspecified: Secondary | ICD-10-CM | POA: Diagnosis not present

## 2016-06-06 DIAGNOSIS — I10 Essential (primary) hypertension: Secondary | ICD-10-CM | POA: Diagnosis not present

## 2016-06-06 DIAGNOSIS — N390 Urinary tract infection, site not specified: Secondary | ICD-10-CM | POA: Diagnosis not present

## 2016-06-06 DIAGNOSIS — R809 Proteinuria, unspecified: Secondary | ICD-10-CM | POA: Diagnosis not present

## 2016-06-26 DIAGNOSIS — N183 Chronic kidney disease, stage 3 (moderate): Secondary | ICD-10-CM | POA: Diagnosis not present

## 2016-06-26 DIAGNOSIS — I10 Essential (primary) hypertension: Secondary | ICD-10-CM | POA: Diagnosis not present

## 2016-06-29 ENCOUNTER — Other Ambulatory Visit: Payer: Self-pay | Admitting: Family

## 2016-06-29 DIAGNOSIS — I10 Essential (primary) hypertension: Secondary | ICD-10-CM

## 2016-07-03 DIAGNOSIS — R809 Proteinuria, unspecified: Secondary | ICD-10-CM | POA: Diagnosis not present

## 2016-07-07 DIAGNOSIS — I129 Hypertensive chronic kidney disease with stage 1 through stage 4 chronic kidney disease, or unspecified chronic kidney disease: Secondary | ICD-10-CM | POA: Diagnosis not present

## 2016-07-07 DIAGNOSIS — R809 Proteinuria, unspecified: Secondary | ICD-10-CM | POA: Diagnosis not present

## 2016-07-07 DIAGNOSIS — N029 Recurrent and persistent hematuria with unspecified morphologic changes: Secondary | ICD-10-CM | POA: Diagnosis not present

## 2016-07-07 DIAGNOSIS — E669 Obesity, unspecified: Secondary | ICD-10-CM | POA: Diagnosis not present

## 2016-07-07 DIAGNOSIS — Z7982 Long term (current) use of aspirin: Secondary | ICD-10-CM | POA: Diagnosis not present

## 2016-07-07 DIAGNOSIS — I10 Essential (primary) hypertension: Secondary | ICD-10-CM | POA: Diagnosis not present

## 2016-07-07 DIAGNOSIS — N025 Recurrent and persistent hematuria with diffuse mesangiocapillary glomerulonephritis: Secondary | ICD-10-CM | POA: Diagnosis not present

## 2016-07-07 DIAGNOSIS — R319 Hematuria, unspecified: Secondary | ICD-10-CM | POA: Diagnosis not present

## 2016-07-07 DIAGNOSIS — E785 Hyperlipidemia, unspecified: Secondary | ICD-10-CM | POA: Diagnosis not present

## 2016-07-07 DIAGNOSIS — Z79899 Other long term (current) drug therapy: Secondary | ICD-10-CM | POA: Diagnosis not present

## 2016-07-07 DIAGNOSIS — Z720 Tobacco use: Secondary | ICD-10-CM | POA: Diagnosis not present

## 2016-07-07 DIAGNOSIS — N183 Chronic kidney disease, stage 3 (moderate): Secondary | ICD-10-CM | POA: Diagnosis not present

## 2016-07-07 DIAGNOSIS — N2889 Other specified disorders of kidney and ureter: Secondary | ICD-10-CM | POA: Diagnosis not present

## 2016-07-08 DIAGNOSIS — R809 Proteinuria, unspecified: Secondary | ICD-10-CM | POA: Diagnosis not present

## 2016-07-08 DIAGNOSIS — E785 Hyperlipidemia, unspecified: Secondary | ICD-10-CM | POA: Diagnosis not present

## 2016-07-08 DIAGNOSIS — N029 Recurrent and persistent hematuria with unspecified morphologic changes: Secondary | ICD-10-CM | POA: Diagnosis not present

## 2016-07-08 DIAGNOSIS — Z79899 Other long term (current) drug therapy: Secondary | ICD-10-CM | POA: Diagnosis not present

## 2016-07-08 DIAGNOSIS — R319 Hematuria, unspecified: Secondary | ICD-10-CM | POA: Diagnosis not present

## 2016-07-08 DIAGNOSIS — E669 Obesity, unspecified: Secondary | ICD-10-CM | POA: Diagnosis not present

## 2016-07-08 DIAGNOSIS — Z7982 Long term (current) use of aspirin: Secondary | ICD-10-CM | POA: Diagnosis not present

## 2016-07-08 DIAGNOSIS — I129 Hypertensive chronic kidney disease with stage 1 through stage 4 chronic kidney disease, or unspecified chronic kidney disease: Secondary | ICD-10-CM | POA: Diagnosis not present

## 2016-07-08 DIAGNOSIS — N183 Chronic kidney disease, stage 3 (moderate): Secondary | ICD-10-CM | POA: Diagnosis not present

## 2016-07-08 DIAGNOSIS — Z720 Tobacco use: Secondary | ICD-10-CM | POA: Diagnosis not present

## 2016-07-12 DIAGNOSIS — R809 Proteinuria, unspecified: Secondary | ICD-10-CM | POA: Diagnosis not present

## 2016-07-12 DIAGNOSIS — N390 Urinary tract infection, site not specified: Secondary | ICD-10-CM | POA: Diagnosis not present

## 2016-07-12 DIAGNOSIS — I10 Essential (primary) hypertension: Secondary | ICD-10-CM | POA: Diagnosis not present

## 2016-08-16 ENCOUNTER — Telehealth: Payer: Self-pay | Admitting: Family

## 2016-08-16 NOTE — Telephone Encounter (Signed)
Patient Name: [name]  DOB: 07/14/1965    Initial Comment Caller states, she is areas of concern- chest pain on left side above breast plate, goes to her back near shoulder. She has cramping on feet, stomach, and legs. Verified    Nurse Assessment  Nurse: Leilani Merl, RN, Heather Date/Time (Eastern Time): 08/16/2016 1:19:05 PM  Confirm and document reason for call. If symptomatic, describe symptoms. ---Caller states, she has areas of concern- chest pain on left side above breast plate, goes to her back near shoulder. She has cramping on feet, stomach, and legs. All of these symptoms have been going on for about a month.  Does the patient have any new or worsening symptoms? ---Yes  Will a triage be completed? ---Yes  Related visit to physician within the last 2 weeks? ---No  Does the PT have any chronic conditions? (i.e. diabetes, asthma, etc.) ---Yes  List chronic conditions. ---See MR  Is the patient pregnant or possibly pregnant? (Ask all females between the ages of 23-55) ---No  Is this a behavioral health or substance abuse call? ---No     Guidelines    Guideline Title Affirmed Question Affirmed Notes  Chest Pain Patient sounds very sick or weak to the triager    Final Disposition User   Go to ED Now (or PCP triage) Leilani Merl, RN, Heather    Referrals  [facility] - ED   Disagree/Comply: Comply

## 2016-08-23 DIAGNOSIS — R252 Cramp and spasm: Secondary | ICD-10-CM | POA: Diagnosis not present

## 2016-08-23 DIAGNOSIS — R509 Fever, unspecified: Secondary | ICD-10-CM | POA: Diagnosis not present

## 2016-08-23 DIAGNOSIS — J028 Acute pharyngitis due to other specified organisms: Secondary | ICD-10-CM | POA: Diagnosis not present

## 2016-08-23 DIAGNOSIS — J029 Acute pharyngitis, unspecified: Secondary | ICD-10-CM | POA: Diagnosis not present

## 2016-09-11 ENCOUNTER — Encounter: Payer: Self-pay | Admitting: Family

## 2016-09-11 ENCOUNTER — Ambulatory Visit (INDEPENDENT_AMBULATORY_CARE_PROVIDER_SITE_OTHER): Payer: BLUE CROSS/BLUE SHIELD | Admitting: Family

## 2016-09-11 VITALS — BP 134/86 | HR 76 | Temp 98.9°F | Resp 16 | Ht 62.0 in | Wt 189.0 lb

## 2016-09-11 DIAGNOSIS — M545 Low back pain, unspecified: Secondary | ICD-10-CM

## 2016-09-11 DIAGNOSIS — Z0001 Encounter for general adult medical examination with abnormal findings: Secondary | ICD-10-CM | POA: Diagnosis not present

## 2016-09-11 DIAGNOSIS — Z1211 Encounter for screening for malignant neoplasm of colon: Secondary | ICD-10-CM | POA: Diagnosis not present

## 2016-09-11 DIAGNOSIS — M549 Dorsalgia, unspecified: Secondary | ICD-10-CM | POA: Diagnosis not present

## 2016-09-11 NOTE — Progress Notes (Signed)
Subjective:    Patient ID: [name], female    DOB: 08/31/1965, 51 y.o.   MRN: [id number]  Chief Complaint  Patient presents with  . CPE    not fasting    HPI:  [name] is a 51 y.o. female who presents today for an annual wellness visit.   1) Health Maintenance -   Diet - Averaging about 2 meals per day consisting of a regular diet; Caffeine intake of 1-2 cups per day   Exercise - No structured exercise.    2) Preventative Exams / Immunizations:  Dental -- Up to date  Vision -- Up to date   Health Maintenance  Topic Date Due  . HIV Screening  11/10/1980  . TETANUS/TDAP  11/10/1984  . COLONOSCOPY  11/11/2015  . INFLUENZA VACCINE  12/27/2016 (Originally 11/22/2016)  . MAMMOGRAM  08/10/2017     There is no immunization history on file for this patient.  3.) Neck pain - This is a new problem. Associated symptom of pain located in her neck has been going on for 2 weeks and described like she has a pinched nerve at times. Course of the symptoms generally wax and wane. Modifying factors include Acetaminophen which has not helped very much. Has done some mild stretching.   4.) Low back pain - This is a new problem. Associated symptom of pain located in her mid/lower back has been going on for about 2 months. Pain is described as a dull pain. Symptoms generally wax and wane. Describes a numbness located in her fourth toe. No numbness today. Modifying factors include Acetaminophen which has not helped very much.   Allergies  Allergen Reactions  . Lisinopril     cough  . Tetanus Toxoid Hives and Rash     Outpatient Medications Prior to Visit  Medication Sig Dispense Refill  . acetaminophen (TYLENOL) 500 MG tablet Take 500-1,000 mg by mouth as needed.    Marland Kitchen amLODipine (NORVASC) 10 MG tablet TAKE 1 TABLET (10 MG TOTAL) BY MOUTH DAILY. 90 tablet 0  . aspirin 81 MG tablet Take 2 tablets (162 mg total) by mouth daily. 90 tablet 3  . losartan-hydrochlorothiazide  (HYZAAR) 100-25 MG tablet TAKE 1 TABLET BY MOUTH DAILY. 90 tablet 0   No facility-administered medications prior to visit.      Past Medical History:  Diagnosis Date  . Anemia   . Hyperlipidemia   . Hypertension   . TIA (transient ischemic attack) 03/2011     Past Surgical History:  Procedure Laterality Date  . ABDOMINAL HYSTERECTOMY  09/11/2011   Procedure: HYSTERECTOMY ABDOMINAL;  Surgeon: Maisie Fus, MD;  Location: [facility];  Service: Gynecology;  Laterality: N/A;  . SALPINGOOPHORECTOMY  09/11/2011   Procedure: SALPINGO OOPHERECTOMY;  Surgeon: Maisie Fus, MD;  Location: [facility];  Service: Gynecology;  Laterality: Bilateral;  . TUBAL LIGATION       Family History  Problem Relation Age of Onset  . Kidney disease Mother   . Hypertension Mother   . Cancer Neg Hx   . Diabetes Neg Hx   . Heart disease Neg Hx   . Hyperlipidemia Neg Hx   . Stroke Neg Hx      Social History   Social History  . Marital status: Single    Spouse name: N/A  . Number of children: 1  . Years of education: 65   Occupational History  . Not on file.   Social History Main Topics  . Smoking  status: Never Smoker  . Smokeless tobacco: Never Used  . Alcohol use No  . Drug use: No  . Sexual activity: Yes    Birth control/ protection: Surgical   Other Topics Concern  . Not on file   Social History Narrative  . No narrative on file      Review of Systems  Constitutional: Denies fever, chills, fatigue, or significant weight gain/loss. HENT: Head: Denies headache or neck pain Ears: Denies changes in hearing, ringing in ears, earache, drainage Nose: Denies discharge, stuffiness, itching, nosebleed, sinus pain Throat: Denies sore throat, hoarseness, dry mouth, sores, thrush Eyes: Denies loss/changes in vision, pain, redness, blurry/double vision, flashing lights Cardiovascular: Denies chest pain/discomfort, tightness, palpitations, shortness of breath with activity, difficulty lying  down, swelling, sudden awakening with shortness of breath Respiratory: Denies shortness of breath, cough, sputum production, wheezing Gastrointestinal: Denies dysphasia, heartburn, change in appetite, nausea, change in bowel habits, rectal bleeding, constipation, diarrhea, yellow skin or eyes Genitourinary: Denies frequency, urgency, burning/pain, blood in urine, incontinence, change in urinary strength. Musculoskeletal: Denies muscle/joint pain, stiffness, back pain, redness or swelling of joints, trauma Skin: Denies rashes, lumps, itching, dryness, color changes, or hair/nail changes Neurological: Denies dizziness, fainting, seizures, weakness, numbness, tingling, tremor Psychiatric - Denies nervousness, stress, depression or memory loss Endocrine: Denies heat or cold intolerance, sweating, frequent urination, excessive thirst, changes in appetite Hematologic: Denies ease of bruising or bleeding     Objective:     BP 134/86 (BP Location: Left Arm, Patient Position: Sitting, Cuff Size: Large)   Pulse 76   Temp 98.9 F (37.2 C) (Oral)   Resp 16   Ht 5\' 2"  (1.575 m)   Wt 189 lb (85.7 kg)   LMP 09/07/2011   SpO2 94%   BMI 34.57 kg/m  Nursing note and vital signs reviewed.  Physical Exam  Constitutional: She is oriented to person, place, and time. She appears well-developed and well-nourished.  HENT:  Head: Normocephalic.  Right Ear: Hearing, tympanic membrane, external ear and ear canal normal.  Left Ear: Hearing, tympanic membrane, external ear and ear canal normal.  Nose: Nose normal.  Mouth/Throat: Uvula is midline, oropharynx is clear and moist and mucous membranes are normal.  Eyes: Conjunctivae and EOM are normal. Pupils are equal, round, and reactive to light.  Neck: Neck supple. No JVD present. No tracheal deviation present. No thyromegaly present.  Cardiovascular: Normal rate, regular rhythm, normal heart sounds and intact distal pulses.   Pulmonary/Chest: Effort normal  and breath sounds normal.  Abdominal: Soft. Bowel sounds are normal. She exhibits no distension and no mass. There is no tenderness. There is no rebound and no guarding.  Musculoskeletal: Normal range of motion. She exhibits no edema or tenderness.  Upper back - no obvious deformity, discoloration, or edema. Palpable tenderness along the left side of the cervical and thoracic paraspinal musculature. Range of motion slightly limited in flexion and greater severity in lateral bending. Distal pulses and sensation are intact and appropriate.  Low back - no obvious deformity, discoloration, or edema. Palpable tenderness along left quadratus lumborum with no significant crepitus or deformity. Range of motion decreased in flexion secondary to discomfort. All other motions are normal. Distal pulses and sensation are intact and appropriate. Negative straight leg raise; negative Faber's.  Lymphadenopathy:    She has no cervical adenopathy.  Neurological: She is alert and oriented to person, place, and time. She has normal reflexes. No cranial nerve deficit. She exhibits normal muscle tone. Coordination normal.  Skin: Skin is warm and dry.  Psychiatric: She has a normal mood and affect. Her behavior is normal. Judgment and thought content normal.       Assessment & Plan:   Problem List Items Addressed This Visit      Other   Encounter for general adult medical examination with abnormal findings - Primary    1) Anticipatory Guidance: Discussed importance of wearing a seatbelt while driving and not texting while driving; changing batteries in smoke detector at least once annually; wearing suntan lotion when outside; eating a balanced and moderate diet; getting physical activity at least 30 minutes per day.  2) Immunizations / Screenings / Labs:  Declines tetanus. All other immunizations are up-to-date per recommendations. Due for colon cancer screening with referral to gastroenterology placed. Breast  cancer screening is up-to-date. No longer a candidate for cervical cancer screening. All other screenings are up-to-date per recommendations. Obtain CBC, CMET, and lipid profile.    Overall adequate exam with risk factors for cardiovascular disease including previous TIA, hyperlipidemia, and essential hypertension. Blood pressure appears adequately controlled current medication regimen and no adverse side effects. Not currently maintained on statin medication for cholesterol. Reevaluate following lipid profile results. Continue to monitor TIA risk factors with LDL goal less than 70. Continue to follow-up with nephrology for chronic kidney disease. Continue other healthy lifestyle behaviors and choices. Follow-up prevention exam in 1 year. Follow-up office visit pending blood work and for chronic conditions.      Relevant Orders   CBC   Comprehensive metabolic panel   Lipid panel   Upper back pain    New onset upper back and cervical pain secondary to muscle tightness appears to be muscle skeletal in nature. Treat conservatively with ice/moist heat, home exercise therapy, and Tylenol as needed. Avoid nonsteroidal anti-inflammatories secondary to kidney function. If symptoms worsen or do not improve consider imaging and/or referral to physical therapy.      Low back pain    Low back pain in the area of the quadratus lumborum. Treat conservatively with ice, moist heat, and home exercise therapy. There is mild concern for possible kidney related pain. Recommend follow-up with nephrology for further assessment. Treat as of muscle skeletal at this time.       Other Visit Diagnoses    Colon cancer screening       Relevant Orders   Ambulatory referral to Gastroenterology       I am having Ms. Cunliffe maintain her acetaminophen, aspirin, amLODipine, and losartan-hydrochlorothiazide.   Meds ordered this encounter  Medications  . losartan-hydrochlorothiazide (HYZAAR) 100-12.5 MG tablet    Sig:  Take 1 tablet by mouth daily.     Follow-up: Return in about 1 month (around 10/12/2016), or if symptoms worsen or fail to improve.   Mauricio Po, FNP

## 2016-09-11 NOTE — Assessment & Plan Note (Signed)
Low back pain in the area of the quadratus lumborum. Treat conservatively with ice, moist heat, and home exercise therapy. There is mild concern for possible kidney related pain. Recommend follow-up with nephrology for further assessment. Treat as of muscle skeletal at this time.

## 2016-09-11 NOTE — Assessment & Plan Note (Signed)
New onset upper back and cervical pain secondary to muscle tightness appears to be muscle skeletal in nature. Treat conservatively with ice/moist heat, home exercise therapy, and Tylenol as needed. Avoid nonsteroidal anti-inflammatories secondary to kidney function. If symptoms worsen or do not improve consider imaging and/or referral to physical therapy.

## 2016-09-11 NOTE — Patient Instructions (Addendum)
Thank you for choosing [facility].  SUMMARY AND INSTRUCTIONS:  Please continue to take your medications as prescribed.  For your back / neck:  Ice / moist heat x 20 minutes every 2 hours and as needed.  Stretches and exercises daily.  Please follow up with nephrology.   Medication:  Your prescription(s) have been submitted to your pharmacy or been printed and provided for you. Please take as directed and contact our office if you believe you are having problem(s) with the medication(s) or have any questions.  Labs:  Please stop by the lab on the lower level of the building for your blood work. Your results will be released to [place] (or called to you) after review, usually within 72 hours after test completion. If any changes need to be made, you will be notified at that same time.  1.) The lab is open from 7:30am to 5:30 pm Monday-Friday 2.) No appointment is necessary 3.) Fasting (if needed) is 6-8 hours after food and drink; black coffee and water are okay   Follow up:  If your symptoms worsen or fail to improve, please contact our office for further instruction, or in case of emergency go directly to the emergency room at the closest medical facility.     Low Back Strain Rehab Ask your health care provider which exercises are safe for you. Do exercises exactly as told by your health care provider and adjust them as directed. It is normal to feel mild stretching, pulling, tightness, or discomfort as you do these exercises, but you should stop right away if you feel sudden pain or your pain gets worse. Do not begin these exercises until told by your health care provider. Stretching and range of motion exercises These exercises warm up your muscles and joints and improve the movement and flexibility of your back. These exercises also help to relieve pain, numbness, and tingling. Exercise A: Single knee to chest   1. Lie on your back on a firm surface with both legs  straight. 2. Bend one of your knees. Use your hands to move your knee up toward your chest until you feel a gentle stretch in your lower back and buttock.  Hold your leg in this position by holding onto the front of your knee.  Keep your other leg as straight as possible. 3. Hold for __________ seconds. 4. Slowly return to the starting position. 5. Repeat with your other leg. Repeat __________ times. Complete this exercise __________ times a day. Exercise B: Prone extension on elbows   1. Lie on your abdomen on a firm surface. 2. Prop yourself up on your elbows. 3. Use your arms to help lift your chest up until you feel a gentle stretch in your abdomen and your lower back.  This will place some of your body weight on your elbows. If this is uncomfortable, try stacking pillows under your chest.  Your hips should stay down, against the surface that you are lying on. Keep your hip and back muscles relaxed. 4. Hold for __________ seconds. 5. Slowly relax your upper body and return to the starting position. Repeat __________ times. Complete this exercise __________ times a day. Strengthening exercises These exercises build strength and endurance in your back. Endurance is the ability to use your muscles for a long time, even after they get tired. Exercise C: Pelvic tilt  1. Lie on your back on a firm surface. Bend your knees and keep your feet flat. 2. Tense your abdominal  muscles. Tip your pelvis up toward the ceiling and flatten your lower back into the floor.  To help with this exercise, you may place a small towel under your lower back and try to push your back into the towel. 3. Hold for __________ seconds. 4. Let your muscles relax completely before you repeat this exercise. Repeat __________ times. Complete this exercise __________ times a day. Exercise D: Alternating arm and leg raises   1. Get on your hands and knees on a firm surface. If you are on a hard floor, you may want to  use padding to cushion your knees, such as an exercise mat. 2. Line up your arms and legs. Your hands should be below your shoulders, and your knees should be below your hips. 3. Lift your left leg behind you. At the same time, raise your right arm and straighten it in front of you.  Do not lift your leg higher than your hip.  Do not lift your arm higher than your shoulder.  Keep your abdominal and back muscles tight.  Keep your hips facing the ground.  Do not arch your back.  Keep your balance carefully, and do not hold your breath. 4. Hold for __________ seconds. 5. Slowly return to the starting position and repeat with your right leg and your left arm. Repeat __________ times. Complete this exercise __________times a day. Exercise J: Single leg lower with bent knees  1. Lie on your back on a firm surface. 2. Tense your abdominal muscles and lift your feet off the floor, one foot at a time, so your knees and hips are bent in an "L" shape (at about 90 degrees).  Your knees should be over your hips and your lower legs should be parallel to the floor. 3. Keeping your abdominal muscles tense and your knee bent, slowly lower one of your legs so your toe touches the ground. 4. Lift your leg back up to return to the starting position.  Do not hold your breath.  Do not let your back arch. Keep your back flat against the ground. 5. Repeat with your other leg. Repeat __________ times. Complete this exercise __________ times a day. Posture and body mechanics   Body mechanics refers to the movements and positions of your body while you do your daily activities. Posture is part of body mechanics. Good posture and healthy body mechanics can help to relieve stress in your body's tissues and joints. Good posture means that your spine is in its natural S-curve position (your spine is neutral), your shoulders are pulled back slightly, and your head is not tipped forward. The following are general  guidelines for applying improved posture and body mechanics to your everyday activities. Standing    When standing, keep your spine neutral and your feet about hip-width apart. Keep a slight bend in your knees. Your ears, shoulders, and hips should line up.  When you do a task in which you stand in one place for a long time, place one foot up on a stable object that is 2-4 inches (5-10 cm) high, such as a footstool. This helps keep your spine neutral. Sitting    When sitting, keep your spine neutral and keep your feet flat on the floor. Use a footrest, if necessary, and keep your thighs parallel to the floor. Avoid rounding your shoulders, and avoid tilting your head forward.  When working at a desk or a computer, keep your desk at a height where your hands are  slightly lower than your elbows. Slide your chair under your desk so you are close enough to maintain good posture.  When working at a computer, place your monitor at a height where you are looking straight ahead and you do not have to tilt your head forward or downward to look at the screen. Resting    When lying down and resting, avoid positions that are most painful for you.  If you have pain with activities such as sitting, bending, stooping, or squatting (flexion-based activities), lie in a position in which your body does not bend very much. For example, avoid curling up on your side with your arms and knees near your chest (fetal position).  If you have pain with activities such as standing for a long time or reaching with your arms (extension-based activities), lie with your spine in a neutral position and bend your knees slightly. Try the following positions:  Lying on your side with a pillow between your knees.  Lying on your back with a pillow under your knees. Lifting    When lifting objects, keep your feet at least shoulder-width apart and tighten your abdominal muscles.  Bend your knees and hips and keep your  spine neutral. It is important to lift using the strength of your legs, not your back. Do not lock your knees straight out.  Always ask for help to lift heavy or awkward objects. This information is not intended to replace advice given to you by your health care provider. Make sure you discuss any questions you have with your health care provider. Document Released: 04/10/2005 Document Revised: 12/16/2015 Document Reviewed: 01/20/2015 Elsevier Interactive Patient Education  2017 [employer]   Cervical Strain and Sprain Rehab Ask your health care provider which exercises are safe for you. Do exercises exactly as told by your health care provider and adjust them as directed. It is normal to feel mild stretching, pulling, tightness, or discomfort as you do these exercises, but you should stop right away if you feel sudden pain or your pain gets worse.Do not begin these exercises until told by your health care provider. Stretching and range of motion exercises These exercises warm up your muscles and joints and improve the movement and flexibility of your neck. These exercises also help to relieve pain, numbness, and tingling. Exercise A: Cervical side bend   1. Using good posture, sit on a stable chair or stand up. 2. Without moving your shoulders, slowly tilt your left / right ear to your shoulder until you feel a stretch in your neck muscles. You should be looking straight ahead. 3. Hold for __________ seconds. 4. Repeat with the other side of your neck. Repeat __________ times. Complete this exercise __________ times a day. Exercise B: Cervical rotation   6. Using good posture, sit on a stable chair or stand up. 7. Slowly turn your head to the side as if you are looking over your left / right shoulder.  Keep your eyes level with the ground.  Stop when you feel a stretch along the side and the back of your neck. 8. Hold for __________ seconds. 9. Repeat this by turning to your other  side. Repeat __________ times. Complete this exercise __________ times a day. Exercise C: Thoracic extension and pectoral stretch  1. Roll a towel or a small blanket so it is about 4 inches (10 cm) in diameter. 2. Lie down on your back on a firm surface. 3. Put the towel lengthwise, under your spine  in the middle of your back. It should not be not under your shoulder blades. The towel should line up with your spine from your middle back to your lower back. 4. Put your hands behind your head and let your elbows fall out to your sides. 5. Hold for __________ seconds. Repeat __________ times. Complete this exercise __________ times a day. Strengthening exercises These exercises build strength and endurance in your neck. Endurance is the ability to use your muscles for a long time, even after your muscles get tired. Exercise D: Upper cervical flexion, isometric  1. Lie on your back with a thin pillow behind your head and a small rolled-up towel under your neck. 2. Gently tuck your chin toward your chest and nod your head down to look toward your feet. Do not lift your head off the pillow. 3. Hold for __________ seconds. 4. Release the tension slowly. Relax your neck muscles completely before you repeat this exercise. Repeat __________ times. Complete this exercise __________ times a day. Exercise E: Cervical extension, isometric   1. Stand about 6 inches (15 cm) away from a wall, with your back facing the wall. 2. Place a soft object, about 6-8 inches (15-20 cm) in diameter, between the back of your head and the wall. A soft object could be a small pillow, a ball, or a folded towel. 3. Gently tilt your head back and press into the soft object. Keep your jaw and forehead relaxed. 4. Hold for __________ seconds. 5. Release the tension slowly. Relax your neck muscles completely before you repeat this exercise. Repeat __________ times. Complete this exercise __________ times a day. Posture and body  mechanics   Body mechanics refers to the movements and positions of your body while you do your daily activities. Posture is part of body mechanics. Good posture and healthy body mechanics can help to relieve stress in your body's tissues and joints. Good posture means that your spine is in its natural S-curve position (your spine is neutral), your shoulders are pulled back slightly, and your head is not tipped forward. The following are general guidelines for applying improved posture and body mechanics to your everyday activities. Standing   When standing, keep your spine neutral and keep your feet about hip-width apart. Keep a slight bend in your knees. Your ears, shoulders, and hips should line up.  When you do a task in which you stand in one place for a long time, place one foot up on a stable object that is 2-4 inches (5-10 cm) high, such as a footstool. This helps keep your spine neutral. Sitting    When sitting, keep your spine neutral and your keep feet flat on the floor. Use a footrest, if necessary, and keep your thighs parallel to the floor. Avoid rounding your shoulders, and avoid tilting your head forward.  When working at a desk or a computer, keep your desk at a height where your hands are slightly lower than your elbows. Slide your chair under your desk so you are close enough to maintain good posture.  When working at a computer, place your monitor at a height where you are looking straight ahead and you do not have to tilt your head forward or downward to look at the screen. Resting  When lying down and resting, avoid positions that are most painful for you. Try to support your neck in a neutral position. You can use a contour pillow or a small rolled-up towel. Your pillow should support  your neck but not push on it. This information is not intended to replace advice given to you by your health care provider. Make sure you discuss any questions you have with your health care  provider. Document Released: 04/10/2005 Document Revised: 12/16/2015 Document Reviewed: 03/17/2015 Elsevier Interactive Patient Education  2017 [employer].

## 2016-09-11 NOTE — Assessment & Plan Note (Addendum)
1) Anticipatory Guidance: Discussed importance of wearing a seatbelt while driving and not texting while driving; changing batteries in smoke detector at least once annually; wearing suntan lotion when outside; eating a balanced and moderate diet; getting physical activity at least 30 minutes per day.  2) Immunizations / Screenings / Labs:  Declines tetanus. All other immunizations are up-to-date per recommendations. Due for colon cancer screening with referral to gastroenterology placed. Breast cancer screening is up-to-date. No longer a candidate for cervical cancer screening. All other screenings are up-to-date per recommendations. Obtain CBC, CMET, and lipid profile.    Overall adequate exam with risk factors for cardiovascular disease including previous TIA, hyperlipidemia, and essential hypertension. Blood pressure appears adequately controlled current medication regimen and no adverse side effects. Not currently maintained on statin medication for cholesterol. Reevaluate following lipid profile results. Continue to monitor TIA risk factors with LDL goal less than 70. Continue to follow-up with nephrology for chronic kidney disease. Continue other healthy lifestyle behaviors and choices. Follow-up prevention exam in 1 year. Follow-up office visit pending blood work and for chronic conditions.

## 2016-09-14 DIAGNOSIS — M542 Cervicalgia: Secondary | ICD-10-CM | POA: Diagnosis not present

## 2016-09-14 DIAGNOSIS — M545 Low back pain: Secondary | ICD-10-CM | POA: Diagnosis not present

## 2016-09-14 DIAGNOSIS — N289 Disorder of kidney and ureter, unspecified: Secondary | ICD-10-CM | POA: Diagnosis not present

## 2016-10-09 DIAGNOSIS — R809 Proteinuria, unspecified: Secondary | ICD-10-CM | POA: Diagnosis not present

## 2016-10-09 DIAGNOSIS — I10 Essential (primary) hypertension: Secondary | ICD-10-CM | POA: Diagnosis not present

## 2016-10-13 DIAGNOSIS — N390 Urinary tract infection, site not specified: Secondary | ICD-10-CM | POA: Diagnosis not present

## 2016-10-13 DIAGNOSIS — I10 Essential (primary) hypertension: Secondary | ICD-10-CM | POA: Diagnosis not present

## 2016-10-13 DIAGNOSIS — N183 Chronic kidney disease, stage 3 (moderate): Secondary | ICD-10-CM | POA: Diagnosis not present

## 2016-10-13 DIAGNOSIS — R809 Proteinuria, unspecified: Secondary | ICD-10-CM | POA: Diagnosis not present

## 2016-11-13 ENCOUNTER — Other Ambulatory Visit: Payer: Self-pay | Admitting: Family

## 2016-11-13 DIAGNOSIS — I10 Essential (primary) hypertension: Secondary | ICD-10-CM

## 2016-11-28 DIAGNOSIS — N390 Urinary tract infection, site not specified: Secondary | ICD-10-CM | POA: Diagnosis not present

## 2016-11-28 DIAGNOSIS — R809 Proteinuria, unspecified: Secondary | ICD-10-CM | POA: Diagnosis not present

## 2016-11-28 DIAGNOSIS — N183 Chronic kidney disease, stage 3 (moderate): Secondary | ICD-10-CM | POA: Diagnosis not present

## 2016-11-28 DIAGNOSIS — I10 Essential (primary) hypertension: Secondary | ICD-10-CM | POA: Diagnosis not present

## 2016-12-06 ENCOUNTER — Telehealth: Payer: Self-pay | Admitting: Family

## 2016-12-06 DIAGNOSIS — Z1231 Encounter for screening mammogram for malignant neoplasm of breast: Secondary | ICD-10-CM

## 2016-12-06 NOTE — Telephone Encounter (Signed)
Pt would like to have a mammogram done through [facility]. They are requring her to have a referral sent over so that she can schedule this. Can this referral be sent over for her?

## 2016-12-06 NOTE — Telephone Encounter (Signed)
Referral put in. Pt aware.

## 2016-12-07 ENCOUNTER — Other Ambulatory Visit: Payer: Self-pay | Admitting: Family

## 2016-12-07 DIAGNOSIS — N63 Unspecified lump in unspecified breast: Secondary | ICD-10-CM

## 2016-12-14 ENCOUNTER — Ambulatory Visit
Admission: RE | Admit: 2016-12-14 | Discharge: 2016-12-14 | Disposition: A | Discharge status: Home / Self Care | Payer: BLUE CROSS/BLUE SHIELD | Source: Ambulatory Visit | Attending: Family | Admitting: Family

## 2016-12-14 DIAGNOSIS — N63 Unspecified lump in unspecified breast: Secondary | ICD-10-CM

## 2016-12-14 DIAGNOSIS — R928 Other abnormal and inconclusive findings on diagnostic imaging of breast: Secondary | ICD-10-CM | POA: Diagnosis not present

## 2016-12-14 DIAGNOSIS — N6489 Other specified disorders of breast: Secondary | ICD-10-CM | POA: Diagnosis not present

## 2017-05-24 ENCOUNTER — Telehealth: Payer: Self-pay

## 2017-05-24 NOTE — Telephone Encounter (Signed)
That is fine 

## 2017-05-24 NOTE — Telephone Encounter (Signed)
Ok with me - I am no longer at [facility]

## 2017-05-24 NOTE — Telephone Encounter (Signed)
Pt requesting transfer of care from [facility] to Dr. Lorelei Pont.

## 2017-05-24 NOTE — Telephone Encounter (Signed)
Copied from [place] [id number]. Topic: Appointment Scheduling - Scheduling Inquiry for Clinic >> May 24, 2017  1:45 PM Cl[place], [name] wrote: Reason for CRM: Pt would like to know if [facility] would be able to accept her on as a Np. She was seeing Calone at [facility] but he is no longer there.   CRM sent b/c I was clear if [facility] does not do internal transfers within the [place] office or [facility] office's.  Please f/u with pt.

## 2017-05-24 NOTE — Telephone Encounter (Signed)
Okay to schedule NP appt w/ Dr. Lorelei Pont at [facility]. Thank you.

## 2017-05-24 NOTE — Telephone Encounter (Signed)
Called pt and left her message to call us back to set up np appt with dr. Lorelei [name].

## 2017-05-25 ENCOUNTER — Encounter: Payer: Self-pay | Admitting: Family Medicine

## 2017-05-25 ENCOUNTER — Ambulatory Visit (INDEPENDENT_AMBULATORY_CARE_PROVIDER_SITE_OTHER): Payer: BLUE CROSS/BLUE SHIELD | Admitting: Family Medicine

## 2017-05-25 ENCOUNTER — Other Ambulatory Visit: Payer: Self-pay

## 2017-05-25 VITALS — BP 158/80 | HR 69 | Temp 98.9°F | Ht 62.0 in | Wt 187.0 lb

## 2017-05-25 DIAGNOSIS — Z1211 Encounter for screening for malignant neoplasm of colon: Secondary | ICD-10-CM | POA: Diagnosis not present

## 2017-05-25 DIAGNOSIS — M940 Chondrocostal junction syndrome [Tietze]: Secondary | ICD-10-CM | POA: Diagnosis not present

## 2017-05-25 DIAGNOSIS — E785 Hyperlipidemia, unspecified: Secondary | ICD-10-CM

## 2017-05-25 DIAGNOSIS — I10 Essential (primary) hypertension: Secondary | ICD-10-CM | POA: Diagnosis not present

## 2017-05-25 DIAGNOSIS — G459 Transient cerebral ischemic attack, unspecified: Secondary | ICD-10-CM | POA: Diagnosis not present

## 2017-05-25 LAB — POCT URINALYSIS DIP (MANUAL ENTRY)
Bilirubin, UA: NEGATIVE
Glucose, UA: NEGATIVE mg/dL
Ketones, POC UA: NEGATIVE mg/dL
Nitrite, UA: NEGATIVE
Protein Ur, POC: 100 mg/dL — AB
Spec Grav, UA: 1.02 (ref 1.010–1.025)
Urobilinogen, UA: 0.2 E.U./dL
pH, UA: 5.5 (ref 5.0–8.0)

## 2017-05-25 MED ORDER — HYDROCHLOROTHIAZIDE 25 MG PO TABS: 25.0000 mg | ORAL_TABLET | Freq: Every day | ORAL | 3 refills | Status: DC

## 2017-05-25 MED ORDER — LOSARTAN POTASSIUM 100 MG PO TABS: 100.0000 mg | ORAL_TABLET | Freq: Every day | ORAL | 3 refills | Status: DC

## 2017-05-25 NOTE — Progress Notes (Signed)
2/1/20198:42 AM  [name] 1966/02/22, 52 y.o. female [id number]  Chief Complaint  Patient presents with  . Establish Care    wants the Cologuard    HPI:   Patient is a 52 y.o. female with past medical history significant for HTN, CKD, HLP and TIA who presents today to establish care.  Previous PCP left practice Sees Dr Celene Squibb, nephrologist, [place], [phone], part of [facility] system  1. HTN - managed by renal, recently separated arb and hctz, takes all meds at lunch time, checks home bp occasionally, renal questions TIA vs hypertensive emergency, reports morning BP always elevated  2. CKD 3- crt ~ 1.5, GFR 40s, s/p renal biopsy of LEFT kidney for proteinuria and hematuria in 06/2016 - severe arterionephrosclerosis in a/w mod to sev tubulointerstitial scarring and focal and segmental glomerular scarring. Focal mesangioproliferative IgA glomerulopathy.  Sees him next week.   3. HLP - managed also by renal, started on fish oil in may 2018, has lab orders for it to be rechecked prior to next weeks appt  4. S/p total hysterectomy for fibroids, denies abnormal paps prior  5. Last mammogram 11/2016, normal, resolved cyst, denies fhx of breast or ovarian cancer. She is complaining of left breast/chest pain, denies any inciting events  6. Has never had colon cancer screening, requesting order for cologuard  7. Declines flu vaccine  Depression screen [facility] 2/9 05/25/2017 09/11/2016  Decreased Interest 0 0  Down, Depressed, Hopeless 0 0  PHQ - 2 Score 0 0    Allergies  Allergen Reactions  . Lisinopril     cough  . Tetanus Toxoid Hives and Rash    Prior to Admission medications   Medication Sig Start Date End Date Taking? Authorizing Provider  amLODipine (NORVASC) 10 MG tablet TAKE 1 TABLET (10 MG TOTAL) BY MOUTH DAILY. 11/13/16  Yes Golden Circle, FNP  aspirin 81 MG tablet Take 2 tablets (162 mg total) by mouth daily. 09/23/12  Yes Janith Lima, MD    losartan-hydrochlorothiazide (HYZAAR) 100-12.5 MG tablet Take 1 tablet by mouth daily.   Yes [provider]  acetaminophen (TYLENOL) 500 MG tablet Take 500-1,000 mg by mouth as needed.    [provider]    Past Medical History:  Diagnosis Date  . Anemia   . CKD (chronic kidney disease), stage III ([place])    renal bx in 06/2016: arterionephrosclerosis and focal IgA glomerulopathy  . Hyperlipidemia   . Hypertension   . TIA (transient ischemic attack) 03/2011    Past Surgical History:  Procedure Laterality Date  . ABDOMINAL HYSTERECTOMY  09/11/2011   Procedure: HYSTERECTOMY ABDOMINAL;  Surgeon: Maisie Fus, MD;  Location: [facility];  Service: Gynecology;  Laterality: N/A;  . SALPINGOOPHORECTOMY  09/11/2011   Procedure: SALPINGO OOPHERECTOMY;  Surgeon: Maisie Fus, MD;  Location: [facility];  Service: Gynecology;  Laterality: Bilateral;  . TUBAL LIGATION      Social History   Tobacco Use  . Smoking status: Never Smoker  . Smokeless tobacco: Never Used  Substance Use Topics  . Alcohol use: No    Family History  Problem Relation Age of Onset  . Kidney disease Mother   . Hypertension Mother   . Breast cancer Maternal Aunt   . Cancer Neg Hx   . Diabetes Neg Hx   . Heart disease Neg Hx   . Hyperlipidemia Neg Hx   . Stroke Neg Hx     Review of Systems  Constitutional:  Negative for chills and fever.  Eyes: Negative for blurred vision and double vision.  Respiratory: Negative for cough and shortness of breath.   Cardiovascular: Positive for chest pain. Negative for palpitations and leg swelling.  Gastrointestinal: Negative for abdominal pain, nausea and vomiting.  Genitourinary: Negative for dysuria and hematuria.  Neurological: Negative for dizziness, sensory change, speech change, focal weakness and headaches.     OBJECTIVE:  Blood pressure (!) 158/80, pulse 69, temperature 98.9 F (37.2 C), temperature source Oral, height 5\' 2"  (1.575 m), weight 187 lb  (84.8 kg), last menstrual period 09/07/2011, SpO2 98 %.  Physical Exam  Constitutional: She is oriented to person, place, and time and well-developed, well-nourished, and in no distress.  HENT:  Head: Normocephalic and atraumatic.  Mouth/Throat: Oropharynx is clear and moist. No oropharyngeal exudate.  Eyes: EOM are normal. Pupils are equal, round, and reactive to light. No scleral icterus.  Neck: Neck supple.  Cardiovascular: Normal rate, regular rhythm and normal heart sounds. Exam reveals no gallop and no friction rub.  No murmur heard. Pulmonary/Chest: Effort normal and breath sounds normal. She has no wheezes. She has no rales. She exhibits tenderness (left costochondral TTP). Right breast exhibits no inverted nipple, no mass, no nipple discharge, no skin change and no tenderness. Left breast exhibits no inverted nipple, no mass, no nipple discharge, no skin change and no tenderness.  Musculoskeletal: She exhibits no edema.  Neurological: She is alert and oriented to person, place, and time. Gait normal.  Skin: Skin is warm and dry.    Results for orders placed or performed in visit on 05/25/17 (from the past 24 hour(s))  POCT urinalysis dipstick     Status: Abnormal   Collection Time: 05/25/17  9:26 AM  Result Value Ref Range   Color, UA yellow yellow   Clarity, UA clear clear   Glucose, UA negative negative mg/dL   Bilirubin, UA negative negative   Ketones, POC UA negative negative mg/dL   Spec Grav, UA 1.020 1.010 - 1.025   Blood, UA small (A) negative   pH, UA 5.5 5.0 - 8.0   Protein Ur, POC =100 (A) negative mg/dL   Urobilinogen, UA 0.2 0.2 or 1.0 E.U./dL   Nitrite, UA Negative Negative   Leukocytes, UA Trace (A) Negative    ASSESSMENT and PLAN  1. Essential hypertension, benign managed by renal discussed taking amlodipine at bedtime to help with elevated morning readings - POCT urinalysis dipstick  2. Hyperlipidemia with target low density lipoprotein (LDL)  cholesterol less than 70 mg/dL Managed by renal  3. TIA (transient ischemic attack) ? Diagnosis, requesting records  4. Costochondritis Discussed supportive measures, unable to do nsaids due to CKD.   5. Colon cancer screening - Cologuard  Requesting records from renal.   Return in about 3 months (around 08/22/2017).    Rutherford Guys, MD [facility] at [place], [place] [address] Ph.  [phone] Fax [phone]

## 2017-05-25 NOTE — Patient Instructions (Addendum)
IF you received an x-ray today, you will receive an invoice from [facility]. Please contact [facility] at [phone] with questions or concerns regarding your invoice.   IF you received labwork today, you will receive an invoice from [place]. Please contact [facility] at [phone] with questions or concerns regarding your invoice.   Our billing staff will not be able to assist you with questions regarding bills from these companies.  You will be contacted with the lab results as soon as they are available. The fastest way to get your results is to activate your My Chart account. Instructions are located on the last page of this paperwork. If you have not heard from [place] regarding the results in 2 weeks, please contact this office.     Costochondritis Costochondritis is swelling and irritation (inflammation) of the tissue (cartilage) that connects your ribs to your breastbone (sternum). This causes pain in the front of your chest. The pain usually starts gradually and involves more than one rib. What are the causes? The exact cause of this condition is not always known. It results from stress on the cartilage where your ribs attach to your sternum. The cause of this stress could be:  Chest injury (trauma).  Exercise or activity, such as lifting.  Severe coughing.  What increases the risk? You may be at higher risk for this condition if you:  Are female.  Are 84?52 years old.  Recently started a new exercise or work activity.  Have low levels of vitamin D.  Have a condition that makes you cough frequently.  What are the signs or symptoms? The main symptom of this condition is chest pain. The pain:  Usually starts gradually and can be sharp or dull.  Gets worse with deep breathing, coughing, or exercise.  Gets better with rest.  May be worse when you press on the sternum-rib connection (tenderness).  How is this diagnosed? This condition is  diagnosed based on your symptoms, medical history, and a physical exam. Your health care provider will check for tenderness when pressing on your sternum. This is the most important finding. You may also have tests to rule out other causes of chest pain. These may include:  A chest X-ray to check for lung problems.  An electrocardiogram (ECG) to see if you have a heart problem that could be causing the pain.  An imaging scan to rule out a chest or rib fracture.  How is this treated? This condition usually goes away on its own over time. Your health care provider may prescribe an NSAID to reduce pain and inflammation. Your health care provider may also suggest that you:  Rest and avoid activities that make pain worse.  Apply heat or cold to the area to reduce pain and inflammation.  Do exercises to stretch your chest muscles.  If these treatments do not help, your health care provider may inject a numbing medicine at the sternum-rib connection to help relieve the pain. Follow these instructions at home:  Avoid activities that make pain worse. This includes any activities that use chest, abdominal, and side muscles.  If directed, put ice on the painful area: ? Put ice in a plastic bag. ? Place a towel between your skin and the bag. ? Leave the ice on for 20 minutes, 2-3 times a day.  If directed, apply heat to the affected area as often as told by your health care provider. Use the heat source that your health care provider  recommends, such as a moist heat pack or a heating pad. ? Place a towel between your skin and the heat source. ? Leave the heat on for 20-30 minutes. ? Remove the heat if your skin turns bright red. This is especially important if you are unable to feel pain, heat, or cold. You may have a greater risk of getting burned.  Take over-the-counter and prescription medicines only as told by your health care provider.  Return to your normal activities as told by your  health care provider. Ask your health care provider what activities are safe for you.  Keep all follow-up visits as told by your health care provider. This is important. Contact a health care provider if:  You have chills or a fever.  Your pain does not go away or it gets worse.  You have a cough that does not go away (is persistent). Get help right away if:  You have shortness of breath. This information is not intended to replace advice given to you by your health care provider. Make sure you discuss any questions you have with your health care provider. Document Released: 01/18/2005 Document Revised: 10/29/2015 Document Reviewed: 08/04/2015 Elsevier Interactive Patient Education  [employer].

## 2017-05-30 DIAGNOSIS — N183 Chronic kidney disease, stage 3 (moderate): Secondary | ICD-10-CM | POA: Diagnosis not present

## 2017-05-30 DIAGNOSIS — I10 Essential (primary) hypertension: Secondary | ICD-10-CM | POA: Diagnosis not present

## 2017-05-31 DIAGNOSIS — N390 Urinary tract infection, site not specified: Secondary | ICD-10-CM | POA: Diagnosis not present

## 2017-05-31 DIAGNOSIS — R809 Proteinuria, unspecified: Secondary | ICD-10-CM | POA: Diagnosis not present

## 2017-05-31 DIAGNOSIS — I10 Essential (primary) hypertension: Secondary | ICD-10-CM | POA: Diagnosis not present

## 2017-05-31 DIAGNOSIS — N183 Chronic kidney disease, stage 3 (moderate): Secondary | ICD-10-CM | POA: Diagnosis not present

## 2017-06-12 NOTE — Telephone Encounter (Signed)
Looks like Pt establish w/ [place].

## 2017-06-21 ENCOUNTER — Other Ambulatory Visit: Payer: Self-pay | Admitting: Family

## 2017-06-21 DIAGNOSIS — I10 Essential (primary) hypertension: Secondary | ICD-10-CM

## 2017-07-06 DIAGNOSIS — R072 Precordial pain: Secondary | ICD-10-CM | POA: Diagnosis not present

## 2017-07-06 DIAGNOSIS — M940 Chondrocostal junction syndrome [Tietze]: Secondary | ICD-10-CM | POA: Diagnosis not present

## 2017-07-06 DIAGNOSIS — Z1211 Encounter for screening for malignant neoplasm of colon: Secondary | ICD-10-CM | POA: Diagnosis not present

## 2017-07-06 DIAGNOSIS — I358 Other nonrheumatic aortic valve disorders: Secondary | ICD-10-CM | POA: Diagnosis not present

## 2017-07-12 ENCOUNTER — Other Ambulatory Visit: Payer: Self-pay | Admitting: Family

## 2017-07-12 DIAGNOSIS — I10 Essential (primary) hypertension: Secondary | ICD-10-CM

## 2017-07-13 ENCOUNTER — Other Ambulatory Visit: Payer: Self-pay | Admitting: Family Medicine

## 2017-07-13 DIAGNOSIS — I10 Essential (primary) hypertension: Secondary | ICD-10-CM

## 2017-07-16 ENCOUNTER — Telehealth: Payer: Self-pay | Admitting: Family Medicine

## 2017-07-16 LAB — COLOGUARD: Cologuard: NEGATIVE

## 2017-07-16 NOTE — Telephone Encounter (Signed)
Patient needs an alternative medication.

## 2017-07-16 NOTE — Telephone Encounter (Signed)
Copied from [place] [id number]. Topic: Quick Communication - Rx Refill/Question >> Jul 16, 2017 11:31 AM Scherrie Gerlach wrote: Medication: losartan (COZAAR) 100 MG tablet Has the patient contacted their pharmacy? Yes pt states she received a letter stating she needs to switch to another med due to this recall  Pt is requesting a new med called into  [id number] [address], [place] (Phone) [phone] (Fax)

## 2017-07-17 MED ORDER — CANDESARTAN CILEXETIL 16 MG PO TABS: 16.0000 mg | ORAL_TABLET | Freq: Every day | ORAL | 4 refills | Status: DC

## 2017-07-17 NOTE — Telephone Encounter (Signed)
Patient informed.  Transferred to make appointment

## 2017-07-17 NOTE — Addendum Note (Signed)
Addended by: Rutherford Guys on: 07/17/2017 01:48 PM   Modules accepted: Orders

## 2017-07-17 NOTE — Telephone Encounter (Signed)
Please let  patient know that I sent a rx for candesartan 16mg  once a day. I would like for her to come back in about 2 weeks to make sure her BP is okay on current dose and that she is not having any issues with this new medication. thanks

## 2017-08-07 ENCOUNTER — Ambulatory Visit: Payer: BLUE CROSS/BLUE SHIELD | Admitting: Family Medicine

## 2017-08-28 ENCOUNTER — Ambulatory Visit (INDEPENDENT_AMBULATORY_CARE_PROVIDER_SITE_OTHER): Payer: BLUE CROSS/BLUE SHIELD | Admitting: Family Medicine

## 2017-08-28 ENCOUNTER — Other Ambulatory Visit: Payer: Self-pay

## 2017-08-28 ENCOUNTER — Encounter: Payer: Self-pay | Admitting: Family Medicine

## 2017-08-28 VITALS — BP 138/82 | HR 74 | Temp 98.2°F | Ht 64.25 in | Wt 181.2 lb

## 2017-08-28 DIAGNOSIS — I10 Essential (primary) hypertension: Secondary | ICD-10-CM

## 2017-08-28 MED ORDER — IRBESARTAN 300 MG PO TABS: 300.0000 mg | ORAL_TABLET | Freq: Every day | ORAL | 2 refills | Status: DC

## 2017-08-28 MED ORDER — HYDROCHLOROTHIAZIDE 12.5 MG PO CAPS: 12.5000 mg | ORAL_CAPSULE | Freq: Every day | ORAL | 1 refills | Status: DC

## 2017-08-28 NOTE — Patient Instructions (Addendum)
1. Return in 2 weeks for nurse BP check. Goal is BP 130/80.    IF you received an x-ray today, you will receive an invoice from [facility]. Please contact [facility] at [phone] with questions or concerns regarding your invoice.   IF you received labwork today, you will receive an invoice from [place]. Please contact [facility] at [phone] with questions or concerns regarding your invoice.   Our billing staff will not be able to assist you with questions regarding bills from these companies.  You will be contacted with the lab results as soon as they are available. The fastest way to get your results is to activate your My Chart account. Instructions are located on the last page of this paperwork. If you have not heard from [place] regarding the results in 2 weeks, please contact this office.

## 2017-08-28 NOTE — Progress Notes (Signed)
5/7/20198:25 AM  [name] Jul 02, 1965, 52 y.o. female [id number]  Chief Complaint  Patient presents with  . Follow-up    BP management  and would like to talk about the medication that she was switched to. She cannot afford it    HPI:   Patient is a 52 y.o. female with past medical history significant for HTN, CKD3, HLP who presents today requesting a change in her ARB  Patient originally was on losartan 100mg  which has been recalled. Prescription was sent for atacand 16mg , she is on a high deductible HSA plan and cost would be $178. She has continued to take losartan, if not BP will rise. She does need a more affordable alternative.  She also continues to take HCTZ 12,5mg , she is requesting to be changed from tablet to capsule. Tablets are too small. She also takes amlodipine 10mg  daily She sees renal is august She has no other concerns today.  Fall Risk  08/28/2017 05/25/2017  Falls in the past year? No No     Depression screen [facility] 2/9 08/28/2017 05/25/2017 09/11/2016  Decreased Interest 0 0 0  Down, Depressed, Hopeless 0 0 0  PHQ - 2 Score 0 0 0    Allergies  Allergen Reactions  . Lisinopril     cough  . Tetanus Toxoid Hives and Rash    Prior to Admission medications   Medication Sig Start Date End Date Taking? Authorizing Provider  acetaminophen (TYLENOL) 500 MG tablet Take 500-1,000 mg by mouth as needed.   Yes [provider]  amLODipine (NORVASC) 10 MG tablet TAKE 1 TABLET (10 MG TOTAL) BY MOUTH DAILY. 07/13/17  Yes Rutherford Guys, MD  aspirin 81 MG tablet Take 2 tablets (162 mg total) by mouth daily. 09/23/12  Yes Janith Lima, MD  hydrochlorothiazide (HYDRODIURIL) 25 MG tablet Take 1 tablet (25 mg total) by mouth daily. 05/25/17  Yes Rutherford Guys, MD  candesartan (ATACAND) 16 MG tablet Take 1 tablet (16 mg total) by mouth daily. Patient not taking: Reported on 08/28/2017 07/17/17   Rutherford Guys, MD    Past Medical History:  Diagnosis Date  .  Anemia   . CKD (chronic kidney disease), stage III ([place])    renal bx in 06/2016: arterionephrosclerosis and focal IgA glomerulopathy  . Hyperlipidemia   . Hypertension   . TIA (transient ischemic attack) 03/2011    Past Surgical History:  Procedure Laterality Date  . ABDOMINAL HYSTERECTOMY  09/11/2011   Procedure: HYSTERECTOMY ABDOMINAL;  Surgeon: Maisie Fus, MD;  Location: [facility];  Service: Gynecology;  Laterality: N/A;  . SALPINGOOPHORECTOMY  09/11/2011   Procedure: SALPINGO OOPHERECTOMY;  Surgeon: Maisie Fus, MD;  Location: [facility];  Service: Gynecology;  Laterality: Bilateral;  . TUBAL LIGATION      Social History   Tobacco Use  . Smoking status: Never Smoker  . Smokeless tobacco: Never Used  Substance Use Topics  . Alcohol use: No    Family History  Problem Relation Age of Onset  . Kidney disease Mother   . Hypertension Mother   . Breast cancer Maternal Aunt   . Cancer Neg Hx   . Diabetes Neg Hx   . Heart disease Neg Hx   . Hyperlipidemia Neg Hx   . Stroke Neg Hx     ROS Per hpi  OBJECTIVE:  Blood pressure 138/82, pulse 74, temperature 98.2 F (36.8 C), temperature source Oral, height 5' 4.25" (1.632 m), weight 181 lb 3.2 oz (  82.2 kg), last menstrual period 09/07/2011, SpO2 100 %.  Physical Exam  Constitutional: She is oriented to person, place, and time.  HENT:  Head: Normocephalic and atraumatic.  Mouth/Throat: Mucous membranes are normal.  Eyes: Pupils are equal, round, and reactive to light. EOM are normal. No scleral icterus.  Neck: Neck supple.  Pulmonary/Chest: Effort normal.  Neurological: She is alert and oriented to person, place, and time.  Skin: Skin is warm and dry.  Nursing note and vitals reviewed.    ASSESSMENT and PLAN  1. Essential hypertension, benign Coupon for good rx for avapro given. Return in 2 weeks for nurse BP check. Follow up with renal as scheduled.  Other orders - irbesartan (AVAPRO) 300 MG tablet; Take 1 tablet (300  mg total) by mouth daily. - hydrochlorothiazide (MICROZIDE) 12.5 MG capsule; Take 1 capsule (12.5 mg total) by mouth daily.  Return in about 3 months (around 11/28/2017) for after renal.    Rutherford Guys, MD [facility] [place], [place] [address] Ph.  [phone] Fax [phone]

## 2017-11-27 ENCOUNTER — Ambulatory Visit: Payer: BLUE CROSS/BLUE SHIELD | Admitting: Family Medicine

## 2017-11-28 DIAGNOSIS — N183 Chronic kidney disease, stage 3 (moderate): Secondary | ICD-10-CM | POA: Diagnosis not present

## 2017-11-28 DIAGNOSIS — I129 Hypertensive chronic kidney disease with stage 1 through stage 4 chronic kidney disease, or unspecified chronic kidney disease: Secondary | ICD-10-CM | POA: Diagnosis not present

## 2017-11-29 DIAGNOSIS — R809 Proteinuria, unspecified: Secondary | ICD-10-CM | POA: Diagnosis not present

## 2017-11-29 DIAGNOSIS — N390 Urinary tract infection, site not specified: Secondary | ICD-10-CM | POA: Diagnosis not present

## 2017-11-29 DIAGNOSIS — I10 Essential (primary) hypertension: Secondary | ICD-10-CM | POA: Diagnosis not present

## 2018-01-01 DIAGNOSIS — R809 Proteinuria, unspecified: Secondary | ICD-10-CM | POA: Diagnosis not present

## 2018-01-01 DIAGNOSIS — N390 Urinary tract infection, site not specified: Secondary | ICD-10-CM | POA: Diagnosis not present

## 2018-01-01 DIAGNOSIS — I10 Essential (primary) hypertension: Secondary | ICD-10-CM | POA: Diagnosis not present

## 2018-02-13 ENCOUNTER — Other Ambulatory Visit: Payer: Self-pay | Admitting: Family Medicine

## 2018-02-13 DIAGNOSIS — I10 Essential (primary) hypertension: Secondary | ICD-10-CM

## 2018-02-13 NOTE — Telephone Encounter (Signed)
Requested medication (s) are due for refill today: yes  Requested medication (s) are on the active medication list: yes  Last refill:  11/14/17  Future visit scheduled: no  Notes to clinic: last CMET 09/11/16  Requested Prescriptions  Pending Prescriptions Disp Refills   hydrochlorothiazide (MICROZIDE) 12.5 MG capsule [Pharmacy Med Name: HYDROCHLOROTHIAZIDE 12.5 MG CP] 90 capsule 1    Sig: TAKE 1 CAPSULE BY MOUTH EVERY DAY     Cardiovascular: Diuretics - Thiazide Failed - 02/13/2018  2:00 AM      Failed - Ca in normal range and within 360 days    Calcium  Date Value Ref Range Status  01/05/2016 9.1 8.4 - 10.5 mg/dL Final         Failed - Cr in normal range and within 360 days    Creatinine, Ser  Date Value Ref Range Status  01/05/2016 1.67 (H) 0.40 - 1.20 mg/dL Final         Failed - K in normal range and within 360 days    Potassium  Date Value Ref Range Status  01/05/2016 3.9 3.5 - 5.1 mEq/L Final         Failed - Na in normal range and within 360 days    Sodium  Date Value Ref Range Status  01/05/2016 140 135 - 145 mEq/L Final         Passed - Last BP in normal range    BP Readings from Last 1 Encounters:  08/28/17 138/82         Passed - Valid encounter within last 6 months    Recent Outpatient Visits          5 months ago Essential hypertension, benign   [facility], Lilia Argue, MD   8 months ago Essential hypertension, benign   [facility], Lilia Argue, MD   1 year ago Encounter for general adult medical examination with abnormal findings   [place], Gregory D, FNP   2 years ago Essential hypertension, benign   [facility] -Aaron Mose, FNP   3 years ago Essential hypertension, benign   [facility] -Aaron Mose, FNP           Signed Prescriptions Disp Refills   amLODipine (NORVASC) 10 MG tablet 90 tablet 1    Sig: TAKE 1  TABLET (10 MG TOTAL) BY MOUTH DAILY.     Cardiovascular:  Calcium Channel Blockers Passed - 02/13/2018  2:00 AM      Passed - Last BP in normal range    BP Readings from Last 1 Encounters:  08/28/17 138/82         Passed - Valid encounter within last 6 months    Recent Outpatient Visits          5 months ago Essential hypertension, benign   [facility], Lilia Argue, MD   8 months ago Essential hypertension, benign   [facility], Lilia Argue, MD   1 year ago Encounter for general adult medical examination with abnormal findings   [place], FNP   2 years ago Essential hypertension, benign   [facility] -Aaron Mose, FNP   3 years ago Essential hypertension, benign   [place], FNP

## 2018-02-13 NOTE — Telephone Encounter (Signed)
Requested Prescriptions  Pending Prescriptions Disp Refills  . hydrochlorothiazide (MICROZIDE) 12.5 MG capsule [Pharmacy Med Name: HYDROCHLOROTHIAZIDE 12.5 MG CP] 90 capsule 1    Sig: TAKE 1 CAPSULE BY MOUTH EVERY DAY     Cardiovascular: Diuretics - Thiazide Failed - 02/13/2018  2:00 AM      Failed - Ca in normal range and within 360 days    Calcium  Date Value Ref Range Status  01/05/2016 9.1 8.4 - 10.5 mg/dL Final         Failed - Cr in normal range and within 360 days    Creatinine, Ser  Date Value Ref Range Status  01/05/2016 1.67 (H) 0.40 - 1.20 mg/dL Final         Failed - K in normal range and within 360 days    Potassium  Date Value Ref Range Status  01/05/2016 3.9 3.5 - 5.1 mEq/L Final         Failed - Na in normal range and within 360 days    Sodium  Date Value Ref Range Status  01/05/2016 140 135 - 145 mEq/L Final         Passed - Last BP in normal range    BP Readings from Last 1 Encounters:  08/28/17 138/82         Passed - Valid encounter within last 6 months    Recent Outpatient Visits          5 months ago Essential hypertension, benign   [facility], Lilia Argue, MD   8 months ago Essential hypertension, benign   [facility], Lilia Argue, MD   1 year ago Encounter for general adult medical examination with abnormal findings   [place], Gregory D, FNP   2 years ago Essential hypertension, benign   [facility], FNP   3 years ago Essential hypertension, benign   [place], Ples Specter, FNP           . amLODipine (NORVASC) 10 MG tablet [Pharmacy Med Name: AMLODIPINE BESYLATE 10 MG TAB] 90 tablet 1    Sig: TAKE 1 TABLET (10 MG TOTAL) BY MOUTH DAILY.     Cardiovascular:  Calcium Channel Blockers Passed - 02/13/2018  2:00 AM      Passed - Last BP in normal range    BP Readings from Last 1 Encounters:   08/28/17 138/82         Passed - Valid encounter within last 6 months    Recent Outpatient Visits          5 months ago Essential hypertension, benign   [facility], Lilia Argue, MD   8 months ago Essential hypertension, benign   [facility], Lilia Argue, MD   1 year ago Encounter for general adult medical examination with abnormal findings   [place], FNP   2 years ago Essential hypertension, benign   [facility]Aaron Mose, FNP   3 years ago Essential hypertension, benign   [place], FNP

## 2018-02-20 ENCOUNTER — Other Ambulatory Visit: Payer: Self-pay | Admitting: Family

## 2018-02-20 DIAGNOSIS — Z1231 Encounter for screening mammogram for malignant neoplasm of breast: Secondary | ICD-10-CM

## 2018-02-28 DIAGNOSIS — Z23 Encounter for immunization: Secondary | ICD-10-CM | POA: Diagnosis not present

## 2018-02-28 DIAGNOSIS — L0211 Cutaneous abscess of neck: Secondary | ICD-10-CM | POA: Diagnosis not present

## 2018-03-01 ENCOUNTER — Ambulatory Visit
Admission: RE | Admit: 2018-03-01 | Discharge: 2018-03-01 | Disposition: A | Discharge status: Home / Self Care | Payer: BLUE CROSS/BLUE SHIELD | Source: Ambulatory Visit | Attending: Family | Admitting: Family

## 2018-03-01 DIAGNOSIS — Z1231 Encounter for screening mammogram for malignant neoplasm of breast: Secondary | ICD-10-CM

## 2018-03-12 ENCOUNTER — Other Ambulatory Visit: Payer: Self-pay | Admitting: Family Medicine

## 2018-03-13 NOTE — Telephone Encounter (Signed)
Call to patient to make follow up appointment- she has change PCP providers. Noted in chart.

## 2018-03-26 ENCOUNTER — Ambulatory Visit (INDEPENDENT_AMBULATORY_CARE_PROVIDER_SITE_OTHER): Payer: BLUE CROSS/BLUE SHIELD | Admitting: Family Medicine

## 2018-03-26 ENCOUNTER — Encounter: Payer: Self-pay | Admitting: Family Medicine

## 2018-03-26 VITALS — BP 162/94 | HR 68 | Temp 98.9°F | Ht 64.25 in | Wt 190.4 lb

## 2018-03-26 DIAGNOSIS — I10 Essential (primary) hypertension: Secondary | ICD-10-CM

## 2018-03-26 DIAGNOSIS — N189 Chronic kidney disease, unspecified: Secondary | ICD-10-CM | POA: Diagnosis not present

## 2018-03-26 DIAGNOSIS — N028 Recurrent and persistent hematuria with other morphologic changes: Secondary | ICD-10-CM

## 2018-03-26 MED ORDER — METOPROLOL SUCCINATE ER 50 MG PO TB24: 50.0000 mg | ORAL_TABLET | Freq: Every day | ORAL | 3 refills | Status: AC

## 2018-03-26 NOTE — Assessment & Plan Note (Signed)
Follows with nephrology.

## 2018-03-26 NOTE — Patient Instructions (Signed)
It was very nice to see you today!  Please start the metoprolol 50 mg once daily.  You can take this with your other medications.  Please keep a close eye on your blood pressure for the next few weeks and send me your blood pressure readings in a few weeks.  Please continue working on healthy, balanced, low-salt diet and regular exercise.  I will see you  back in a few months for your physical, or sooner as needed.  Take care, Dr Jerline Pain

## 2018-03-26 NOTE — Assessment & Plan Note (Addendum)
Above goal today.  Discussed increasing HCTZ to 25 mg daily however she declined because she had cramps at this dose in the past.  We will continue her with a 12.5 mg dose.  Also continue losartan 100 mg daily.  We will start metoprolol succinate 50 mg daily today as well.  Discussed potential side effects of medication.  She will continue home blood pressure monitoring with goal 140/90 or lower.  She will follow-up with me in a few months for her CPE.  She will send me her blood pressure readings in a few weeks via my chart.

## 2018-03-26 NOTE — Progress Notes (Signed)
   Subjective:  [name] is a 52 y.o. female who presents today with a chief complaint of HTN and to transfer care to this office.   HPI:  HTN Several year history.  Currently on losartan 100 mg daily and HCTZ 12.5 mg.  She has been on several other blood pressure medication in the past including amlodipine, Benicar, lisinopril, and irbesartan.  She did not tolerate lisinopril due to cough.  Did not tolerate amlodipine due to lower extremity swelling.  She checks her blood pressure at home and they are typically in the 150s to 170s over 80s to 90s.  No chest pain or shortness of breath.  Endorses quite a few dietary indiscretions.  Does not regularly exercise.  ROS: Per HPI  PMH: She reports that she has never smoked. She has never used smokeless tobacco. She reports that she does not drink alcohol or use drugs.  Objective:  Physical Exam: BP (!) 162/94 (BP Location: Left Arm, Patient Position: Sitting, Cuff Size: Large)   Pulse 68   Temp 98.9 F (37.2 C) (Oral)   Ht 5' 4.25" (1.632 m)   Wt 190 lb 6.1 oz (86.4 kg)   LMP 09/07/2011   SpO2 99%   BMI 32.42 kg/m   Gen: NAD, resting comfortably CV: RRR with no murmurs appreciated Pulm: NWOB, CTAB with no crackles, wheezes, or rhonchi GI: Normal bowel sounds present. Soft, Nontender, Nondistended. MSK: No edema, cyanosis, or clubbing noted Skin: Warm, dry Neuro: Grossly normal, moves all extremities Psych: Normal affect and thought content  Assessment/Plan:  Essential hypertension, benign Above goal today.  Discussed increasing HCTZ to 25 mg daily however she declined because she had cramps at this dose in the past.  We will continue her with a 12.5 mg dose.  Also continue losartan 100 mg daily.  We will start metoprolol succinate 50 mg daily today as well.  Discussed potential side effects of medication.  She will continue home blood pressure monitoring with goal 140/90 or lower.  She will follow-up with me in a few months for  her CPE.  She will send me her blood pressure readings in a few weeks via my chart.  CKD (chronic kidney disease) Follows with nephrology.  IgA nephropathy Follows with nephrology every 3 months.  Time Spent: I spent >25 minutes face-to-face with the patient, with more than half spent on counseling for management plan for her essential hypertension.   Algis Greenhouse. Jerline Pain, MD 03/26/2018 5:08 PM

## 2018-03-26 NOTE — Assessment & Plan Note (Signed)
Follows with nephrology every 3 months.

## 2018-03-31 ENCOUNTER — Encounter (HOSPITAL_COMMUNITY): Payer: Self-pay | Admitting: Emergency Medicine

## 2018-03-31 ENCOUNTER — Emergency Department (HOSPITAL_COMMUNITY)
Admission: EM | Admit: 2018-03-31 | Discharge: 2018-04-01 | Disposition: A | Discharge status: Home / Self Care | Payer: BLUE CROSS/BLUE SHIELD | Attending: Emergency Medicine | Admitting: Emergency Medicine

## 2018-03-31 ENCOUNTER — Encounter: Payer: Self-pay | Admitting: Family Medicine

## 2018-03-31 DIAGNOSIS — Z8673 Personal history of transient ischemic attack (TIA), and cerebral infarction without residual deficits: Secondary | ICD-10-CM | POA: Diagnosis not present

## 2018-03-31 DIAGNOSIS — E785 Hyperlipidemia, unspecified: Secondary | ICD-10-CM | POA: Insufficient documentation

## 2018-03-31 DIAGNOSIS — Z7982 Long term (current) use of aspirin: Secondary | ICD-10-CM | POA: Insufficient documentation

## 2018-03-31 DIAGNOSIS — N183 Chronic kidney disease, stage 3 (moderate): Secondary | ICD-10-CM | POA: Insufficient documentation

## 2018-03-31 DIAGNOSIS — Z79899 Other long term (current) drug therapy: Secondary | ICD-10-CM | POA: Insufficient documentation

## 2018-03-31 DIAGNOSIS — I129 Hypertensive chronic kidney disease with stage 1 through stage 4 chronic kidney disease, or unspecified chronic kidney disease: Secondary | ICD-10-CM | POA: Diagnosis not present

## 2018-03-31 DIAGNOSIS — I10 Essential (primary) hypertension: Secondary | ICD-10-CM | POA: Diagnosis not present

## 2018-03-31 MED ORDER — HYDRALAZINE HCL 20 MG/ML IJ SOLN
10.0000 mg | Freq: Once | INTRAMUSCULAR | Status: AC
  Administered 2018-03-31: 10 mg via INTRAVENOUS
  Filled 2018-03-31: qty 1

## 2018-03-31 NOTE — ED Triage Notes (Signed)
Patient reports headache with hypertension today , pt. stated recent changes in her antihypertensive medication by PCP this week .

## 2018-03-31 NOTE — ED Provider Notes (Signed)
[facility] EMERGENCY DEPARTMENT Provider Note   CSN: [id number] Arrival date & time: 03/31/18  2156     History   Chief Complaint Chief Complaint  Patient presents with  . Hypertension    HPI [name] is a 52 y.o. female with a PMH of HTN, CKD, and HLD presents with hypertension onset at 3:30pm today. Patient states she had an associated right sided headache along with her symptoms initially, but symptoms have completely resolved. Patient states she has had problems with her blood pressure since she was diagnosed with HTN in 2005. Patient reports her PCP added a beta blocker 3 days ago. Patient states she checked her blood pressure at home and it was 165/105. Patient reports weight gain and states she has not been making lifestyle modifications to control her BP. Patient denies chest pain, shortness of breath, vision changes, extremity edema, weakness, abdominal pain, nausea, or vomiting.   HPI  Past Medical History:  Diagnosis Date  . Anemia   . CKD (chronic kidney disease), stage III ([place])    renal bx in 06/2016: arterionephrosclerosis and focal IgA glomerulopathy  . Hyperlipidemia   . Hypertension   . TIA (transient ischemic attack) 03/2011    Patient Active Problem List   Diagnosis Date Noted  . CKD (chronic kidney disease) 03/26/2018  . IgA nephropathy 03/26/2018  . Upper back pain 09/11/2016  . Low back pain 09/11/2016  . Hyperlipidemia with target low density lipoprotein (LDL) cholesterol less than 70 mg/dL 09/23/2012  . TIA (transient ischemic attack) 05/31/2011  . Essential hypertension, benign 05/31/2011  . ACID REFLUX DISEASE 05/18/2008    Past Surgical History:  Procedure Laterality Date  . ABDOMINAL HYSTERECTOMY  09/11/2011   Procedure: HYSTERECTOMY ABDOMINAL;  Surgeon: Maisie Fus, MD;  Location: [facility];  Service: Gynecology;  Laterality: N/A;  . SALPINGOOPHORECTOMY  09/11/2011   Procedure: SALPINGO OOPHERECTOMY;  Surgeon: Maisie Fus, MD;  Location: [facility];  Service: Gynecology;  Laterality: Bilateral;  . TUBAL LIGATION       OB History   None      Home Medications    Prior to Admission medications   Medication Sig Start Date End Date Taking? Authorizing Provider  acetaminophen (TYLENOL) 500 MG tablet Take 500-1,000 mg by mouth every 6 (six) hours as needed for mild pain.    Yes [provider]  aspirin 81 MG tablet Take 2 tablets (162 mg total) by mouth daily. 09/23/12  Yes Janith Lima, MD  hydrochlorothiazide (MICROZIDE) 12.5 MG capsule TAKE 1 CAPSULE BY MOUTH EVERY DAY Patient taking differently: Take 12.5 mg by mouth daily.  02/13/18  Yes Rutherford Guys, MD  losartan (COZAAR) 100 MG tablet Take 100 mg by mouth daily.   Yes [provider]  metoprolol succinate (TOPROL-XL) 50 MG 24 hr tablet Take 1 tablet (50 mg total) by mouth daily. Take with or immediately following a meal. 03/26/18  Yes Vivi Barrack, MD  Omega-3 Fatty Acids (FISH OIL) 1000 MG CPDR Take 2 tablets by mouth 2 (two) times daily.    Yes [provider]    Family History Family History  Problem Relation Age of Onset  . Kidney disease Mother   . Hypertension Mother   . Breast cancer Maternal Aunt   . Diabetes Neg Hx   . Heart disease Neg Hx   . Hyperlipidemia Neg Hx   . Stroke Neg Hx     Social History Social History  Tobacco Use  . Smoking status: Never Smoker  . Smokeless tobacco: Never Used  Substance Use Topics  . Alcohol use: No  . Drug use: No     Allergies   Lisinopril and Tetanus toxoid   Review of Systems Review of Systems  Constitutional: Positive for unexpected weight change (Pt reports weight gain.). Negative for activity change, appetite change, chills, diaphoresis, fatigue and fever.  HENT: Negative for congestion and rhinorrhea.   Respiratory: Negative for cough, chest tightness, shortness of breath and wheezing.   Cardiovascular: Negative for chest pain,  palpitations and leg swelling.  Gastrointestinal: Negative for abdominal pain, nausea and vomiting.  Endocrine: Negative for cold intolerance and heat intolerance.  Musculoskeletal: Negative for back pain.  Skin: Negative for rash.  Allergic/Immunologic: Negative for immunocompromised state.  Neurological: Positive for headaches. Negative for dizziness, syncope, weakness and light-headedness.  Psychiatric/Behavioral: Negative for agitation and behavioral problems. The patient is not nervous/anxious.      Physical Exam Updated Vital Signs BP (!) 157/88 (BP Location: Left Arm)   Pulse 68   Temp (!) 97.5 F (36.4 C) (Oral)   Resp 16   Ht 5\' 2"  (1.575 m)   Wt 86.2 kg   LMP 09/07/2011   SpO2 99%   BMI 34.75 kg/m   Physical Exam   ED Treatments / Results  Labs (all labs ordered are listed, but only abnormal results are displayed) Labs Reviewed - No data to display  EKG EKG Interpretation  Date/Time:  Sunday March 31 2018 22:11:22 EST Ventricular Rate:  55 PR Interval:    QRS Duration: 104 QT Interval:  463 QTC Calculation: 443 R Axis:   38 Text Interpretation:  Sinus rhythm Abnormal R-wave progression, early transition similar to previous Confirmed by Theotis Burrow [phone]) on 03/31/2018 10:35:17 PM   Radiology No results found.  Procedures Procedures (including critical care time)  Medications Ordered in ED Medications  hydrALAZINE (APRESOLINE) injection 10 mg (10 mg Intravenous Given 03/31/18 2249)     Initial Impression / Assessment and Plan / ED Course  I have reviewed the triage vital signs and the nursing notes.  Pertinent labs & imaging results that were available during my care of the patient were reviewed by me and considered in my medical decision making (see chart for details).  Clinical Course as of Apr 01 21  Nancy Fetter Mar 31, 2018  2336 Pt reports she is asymptomatic and blood pressure has improved.    [AH]    Clinical Course User Index [AH]  Arville Lime, [place]   Patient presents with complaint of hypertension. Patient nontoxic appearing, in no apparent distress, vitals improved.  Patient was evaluated for hypertension. Patient was given hydralazine for blood pressure. Patient's blood pressure improved. Patient has been asymptomatic while in the ER. Discussed importance of following up with PCP regarding blood pressure monitoring and control. Patient states she understands and agrees with plan.  Vitals:   03/31/18 2245 03/31/18 2300 03/31/18 2315 03/31/18 2348  BP: (!) 164/95 139/79 (!) 152/89 (!) 157/88  Pulse: (!) 59 72 68 68  Resp: 13 17 18 16   Temp:      TempSrc:      SpO2: 98% 99% 99% 99%  Weight:      Height:       Doubt need for further emergent work up at this time. I discussed results, treatment plan, need for PCP follow-up, and return precautions to return to the ER including for any other new or worsening  symptoms with the patient. Provided opportunity for questions, patient confirmed understanding and is in agreement with plan. I have answered their questions. Discharge instructions concerning home care and prescriptions have been given. The patient is STABLE and is discharged to home in good condition. Encouraged patient to follow up with PCP and have PCP obtain results of this visit in 2 days or sooner if needed.    Final Clinical Impressions(s) / ED Diagnoses   Final diagnoses:  Essential hypertension    ED Discharge Orders    None       Arville Lime, PA-C 04/01/18 0022    Little, Wenda Overland, MD 04/01/18 [phone]

## 2018-04-01 NOTE — Discharge Instructions (Addendum)
You have been seen today for hypertension. Please read and follow all provided instructions.   1. Medications: usual home medications 2. Treatment: rest, drink plenty of fluids 3. Follow Up: Please follow up with your primary doctor in 2 days for discussion of your diagnoses, evaluate your blood pressure, and further evaluation after today's visit; if you do not have a primary care doctor use the resource guide provided to find one; Please return to the ER for any new or worsening symptoms. Please obtain all of your results from medical records or have your doctors office obtain the results - share them with your doctor - you should be seen at your doctors office. Call today to arrange your follow up.   Take medications as prescribed. Please review all of the medicines and only take them if you do not have an allergy to them. Return to the emergency room for worsening condition or new concerning symptoms. Follow up with your regular doctor. If you don't have a regular doctor use one of the numbers below to establish a primary care doctor.  Please be aware that if you are taking birth control pills, taking other prescriptions, ESPECIALLY ANTIBIOTICS may make the birth control ineffective - if this is the case, either do not engage in sexual activity or use alternative methods of birth control such as condoms until you have finished the medicine and your family doctor says it is OK to restart them. If you are on a blood thinner such as COUMADIN, be aware that any other medicine that you take may cause the coumadin to either work too much, or not enough - you should have your coumadin level rechecked in next 7 days if this is the case.  ?  It is also a possibility that you have an allergic reaction to any of the medicines that you have been prescribed - Everybody reacts differently to medications and while MOST people have no trouble with most medicines, you may have a reaction such as nausea, vomiting, rash,  swelling, shortness of breath. If this is the case, please stop taking the medicine immediately and contact your physician.  ?  You should return to the ER if you develop severe or worsening symptoms.   Emergency Department Resource Guide 1) Find a Doctor and Pay Out of Pocket Although you won't have to find out who is covered by your insurance plan, it is a good idea to ask around and get recommendations. You will then need to call the office and see if the doctor you have chosen will accept you as a new patient and what types of options they offer for patients who are self-pay. Some doctors offer discounts or will set up payment plans for their patients who do not have insurance, but you will need to ask so you aren't surprised when you get to your appointment.  2) Contact Your Local Health Department Not all health departments have doctors that can see patients for sick visits, but many do, so it is worth a call to see if yours does. If you don't know where your local health department is, you can check in your phone book. The CDC also has a tool to help you locate your state's health department, and many state websites also have listings of all of their local health departments.  3) Find a [facility] If your illness is not likely to be very severe or complicated, you may want to try a walk in clinic. These are popping  up all over the country in pharmacies, drugstores, and shopping centers. They're usually staffed by nurse practitioners or physician assistants that have been trained to treat common illnesses and complaints. They're usually fairly quick and inexpensive. However, if you have serious medical issues or chronic medical problems, these are probably not your best option.  No Primary Care Doctor: Call Health Connect at  [phone] - they can help you locate a primary care doctor that  accepts your insurance, provides certain services, etc. Physician Referral [phone]  Emergency Department Resource Guide 1) Find a Doctor and Pay Out of Pocket Although you won't have to find out who is covered by your insurance plan, it is a good idea to ask around and get recommendations. You will then need to call the office and see if the doctor you have chosen will accept you as a new patient and what types of options they offer for patients who are self-pay. Some doctors offer discounts or will set up payment plans for their patients who do not have insurance, but you will need to ask so you aren't surprised when you get to your appointment.  2) Contact Your Local Health Department Not all health departments have doctors that can see patients for sick visits, but many do, so it is worth a call to see if yours does. If you don't know where your local health department is, you can check in your phone book. The CDC also has a tool to help you locate your state's health department, and many state websites also have listings of all of their local health departments.  3) Find a [facility] If your illness is not likely to be very severe or complicated, you may want to try a walk in clinic. These are popping up all over the country in pharmacies, drugstores, and shopping centers. They're usually staffed by nurse practitioners or physician assistants that have been trained to treat common illnesses and complaints. They're usually fairly quick and inexpensive. However, if you have serious medical issues or chronic medical problems, these are probably not your best option.  No Primary Care Doctor: Call Health Connect at  [phone] - they can help you locate a primary care doctor that  accepts your insurance, provides certain services, etc. Physician Referral Service- [phone]  Chronic Pain Problems: Organization         Address  Phone   Notes  [facility]  [phone] Patients need to be referred by their primary care doctor.    Medication Assistance: Organization         Address  Phone   Notes  [facility] Medication [facility] [address] [place], [place] [address] [phone] --Must be a resident of [facility] -- Must have NO insurance coverage whatsoever (no Medicaid/ Medicare, etc.) -- The pt. MUST have a primary care doctor that directs their care regularly and follows them in the community   [facility]  [phone]   [employer]  [phone]    Agencies that provide inexpensive medical care: Organization         Address  Phone   Notes  [place]  [phone]   [facility]    [phone]   [facility] [place], [place] [address] [phone]   [facility] [address] [place] [phone]   [facility]anned [facility]    [phone]  [facility]    [phone]   [facility]  [address], [place] Phone:  [phone], Fax:  [phone] Hours of Operation:  9 am - 6 pm, M-F.  Also accepts Medicaid/Medicare and self-pay.  [facility] for [address], [address], [place] Phone: [phone], Fax: [phone]. Hours of Operation:  8:30 am - 5:30 pm, M-F.  Also accepts Medicaid and self-pay.  [facility] [place] [facility] [address], [place] Phone: [phone]   [place], [place], [place] [phone], [address]. [phone] Mondays & Thursdays: 7-9 AM.  First 15 patients are seen on a first come, first serve basis.    [place] Providers:  Organization         Address  Phone   Notes  [facility] [address], [place] [phone] Also accepts self-pay patients.  [facility] [address], [place]  [phone]   [place], [address], [place] [phone]   [facility] [address], [place] [phone]   Lucianne Lei [address], [place]   [phone] Only accepts [place] [facility] [place] patients after they have their name applied to their card.   Self-Pay (no insurance) in [facility]:  Organization         Address  Phone   Notes  Sickle Cell Patients, [facility] [place] [phone]   [facility] [place] [phone]   [facility] [place]  [place], [place], [place] [phone]   [facility]/Dr. Osei-Bonsu  [address] [place] or [address], [place] [phone] Phone number for both [place] and [place] locations is the same.  [facility]gent [facility] and [facility] [address], [place] [phone]   [facility] [address] [place] or [address] [phone]   [facility] [address] [place] [phone], phone; [phone], fax Sees patients 1st and 3rd Saturday of every month.  Must not qualify for public or private insurance (i.e. Medicaid, Medicare, [place] Health Choice, Veterans' Benefits)  Household income should be no more than 200% of the poverty level The clinic cannot treat you if you are pregnant or think you are pregnant  Sexually transmitted diseases are not treated at the clinic.

## 2018-04-02 ENCOUNTER — Encounter: Payer: Self-pay | Admitting: Family Medicine

## 2018-04-02 ENCOUNTER — Ambulatory Visit: Payer: BLUE CROSS/BLUE SHIELD

## 2018-04-02 ENCOUNTER — Ambulatory Visit (INDEPENDENT_AMBULATORY_CARE_PROVIDER_SITE_OTHER): Payer: BLUE CROSS/BLUE SHIELD | Admitting: Family Medicine

## 2018-04-02 VITALS — BP 168/102 | HR 66 | Temp 98.4°F | Ht 64.0 in | Wt 188.6 lb

## 2018-04-02 DIAGNOSIS — I10 Essential (primary) hypertension: Secondary | ICD-10-CM | POA: Diagnosis not present

## 2018-04-02 MED ORDER — SPIRONOLACTONE 50 MG PO TABS: 50.0000 mg | ORAL_TABLET | Freq: Every day | ORAL | 2 refills | Status: AC

## 2018-04-02 NOTE — Assessment & Plan Note (Signed)
Patient well above goal today.  Unclear why she is not responding to the metoprolol.  She likely has some form of resistant hypertension.  Will check aldosterone/renin activity, CBC, CMET, and TSH.  Will add on spironolactone 50 mg daily to her current regimen.  Continue HCTZ 12.5 mg daily, losartan 100 mg daily, and metoprolol succinate 50 mg daily.  Discussed strict home blood pressure monitoring with goal 140/90.  Follow-up with me in 2 weeks.

## 2018-04-02 NOTE — Progress Notes (Signed)
   Subjective:  [name] is a 52 y.o. female who presents today with a chief complaint of HTN.   HPI:  HTN, chronic problem, uncontrolled Patient was last seen here a week ago for elevated blood pressure.  That time we added on metoprolol succinate 50 mg daily to her regimen.  She was continued on HCTZ 12.5 mg daily and losartan 100 mg daily.  2 days ago, she started having a headache and checked her blood pressure.  Reportedly it was into the 180s.  She went to the emergency department and was reportedly found to have blood pressure readings into the 200s over 100s.  She was given a dose of hydralazine with good improvement in her blood pressure readings and that she was discharged home.  Over the last couple of days blood pressures consistently been in the 150-160/100 range.  Does not have any headache.  No chest pain or shortness of breath.  No weakness or numbness.  ROS: Per HPI  Objective:  Physical Exam: BP (!) 168/102 (BP Location: Left Arm, Patient Position: Sitting, Cuff Size: Normal)   Pulse 66   Temp 98.4 F (36.9 C) (Oral)   Ht 5\' 4"  (1.626 m)   Wt 188 lb 9.6 oz (85.5 kg)   LMP 09/07/2011   SpO2 98%   BMI 32.37 kg/m   Gen: NAD, resting comfortably CV: RRR with no murmurs appreciated Pulm: NWOB, CTAB with no crackles, wheezes, or rhonchi GI: Normal bowel sounds present. Soft, Nontender, Nondistended. MSK: No edema, cyanosis, or clubbing noted  Assessment/Plan:  Essential hypertension, benign Patient well above goal today.  Unclear why she is not responding to the metoprolol.  She likely has some form of resistant hypertension.  Will check aldosterone/renin activity, CBC, CMET, and TSH.  Will add on spironolactone 50 mg daily to her current regimen.  Continue HCTZ 12.5 mg daily, losartan 100 mg daily, and metoprolol succinate 50 mg daily.  Discussed strict home blood pressure monitoring with goal 140/90.  Follow-up with me in 2 weeks.    Algis Greenhouse. Jerline Pain,  MD 04/02/2018 5:23 PM

## 2018-04-02 NOTE — Patient Instructions (Signed)
It was very nice to see you today!  We will start additional blood pressure medication today.  Please take spironolactone 50 mg once daily.  Please let me know if you have any side effects.  We will check blood work today to see if there are any other causes of your high blood pressure.  Keep an eye on your blood pressure next 2 weeks and let me know if persistently 140/90 or higher.  Come back to see me in 1 to 2 weeks for blood pressure recheck, or sooner as needed.  Take care, Dr Jerline Pain

## 2018-04-03 ENCOUNTER — Other Ambulatory Visit: Payer: Self-pay

## 2018-04-03 ENCOUNTER — Telehealth: Payer: Self-pay | Admitting: Family Medicine

## 2018-04-03 DIAGNOSIS — N189 Chronic kidney disease, unspecified: Secondary | ICD-10-CM

## 2018-04-03 DIAGNOSIS — I10 Essential (primary) hypertension: Secondary | ICD-10-CM

## 2018-04-03 DIAGNOSIS — N028 Recurrent and persistent hematuria with other morphologic changes: Secondary | ICD-10-CM

## 2018-04-03 DIAGNOSIS — E785 Hyperlipidemia, unspecified: Secondary | ICD-10-CM

## 2018-04-03 DIAGNOSIS — E669 Obesity, unspecified: Secondary | ICD-10-CM

## 2018-04-03 LAB — COMPREHENSIVE METABOLIC PANEL WITH GFR
ALT: 11 U/L (ref 0–35)
AST: 16 U/L (ref 0–37)
Albumin: 4 g/dL (ref 3.5–5.2)
Alkaline Phosphatase: 74 U/L (ref 39–117)
BUN: 40 mg/dL — ABNORMAL HIGH (ref 6–23)
CO2: 25 meq/L (ref 19–32)
Calcium: 9.3 mg/dL (ref 8.4–10.5)
Chloride: 104 meq/L (ref 96–112)
Creatinine, Ser: 2.07 mg/dL — ABNORMAL HIGH (ref 0.40–1.20)
GFR: 32.29 mL/min — ABNORMAL LOW (ref >60.00)
Glucose, Bld: 92 mg/dL (ref 70–99)
Potassium: 4.2 meq/L (ref 3.5–5.1)
Sodium: 138 meq/L (ref 135–145)
Total Bilirubin: 0.3 mg/dL (ref 0.2–1.2)
Total Protein: 7.5 g/dL (ref 6.0–8.3)

## 2018-04-03 LAB — CBC
HCT: 39.4 % (ref 36.0–46.0)
Hemoglobin: 13.1 g/dL (ref 12.0–15.0)
MCHC: 33.1 g/dL (ref 30.0–36.0)
MCV: 82.3 fl (ref 78.0–100.0)
Platelets: 336 K/uL (ref 150.0–400.0)
RBC: 4.79 Mil/uL (ref 3.87–5.11)
RDW: 14.2 % (ref 11.5–15.5)
WBC: 8.7 K/uL (ref 4.0–10.5)

## 2018-04-03 LAB — TSH: TSH: 2.05 u[IU]/mL (ref 0.35–4.50)

## 2018-04-03 NOTE — Telephone Encounter (Signed)
See note  Copied from [place] [phone]. Topic: Referral - Request for Referral >> Apr 03, 2018  2:04 PM Yvette Rack wrote: Has patient seen PCP for this complaint? Yes.  They have discuss weight loss and sodium intake *If NO, is insurance requiring patient see PCP for this issue before PCP can refer them? Referral for which specialty: Nutrition Preferred provider/office: [facility] (P) [phone] Reason for referral: Nutrition

## 2018-04-03 NOTE — Telephone Encounter (Signed)
Referral has been placed.  Patient will be contacted to schedule appointment.

## 2018-04-06 LAB — ALDOSTERONE + RENIN ACTIVITY W/ RATIO
ALDO / PRA RATIO: 2.8 ratio (ref 0.9–28.9)
ALDOSTERONE: 5 ng/dL
RENIN ACTIVITY: 1.8 ng/mL/h (ref 0.25–5.82)

## 2018-04-08 NOTE — Progress Notes (Signed)
Please inform patient of the following:  Her kidney numbers are a bit higher than I would like. All of her other testing is normal. Would like for her to make sure she is staying well hydrated and would like for her to come back in 1-2 weeks to recheck. Please place future order for BMET for elevated creatinine. Alternatively, she can follow up with her kidney doctor to have them recheck.  Algis Greenhouse. Jerline Pain, MD 04/08/2018 8:32 AM

## 2018-04-09 ENCOUNTER — Other Ambulatory Visit: Payer: Self-pay | Admitting: Family Medicine

## 2018-04-09 DIAGNOSIS — R748 Abnormal levels of other serum enzymes: Secondary | ICD-10-CM

## 2018-04-18 DIAGNOSIS — I10 Essential (primary) hypertension: Secondary | ICD-10-CM | POA: Diagnosis not present

## 2018-04-18 DIAGNOSIS — N390 Urinary tract infection, site not specified: Secondary | ICD-10-CM | POA: Diagnosis not present

## 2018-04-18 DIAGNOSIS — R809 Proteinuria, unspecified: Secondary | ICD-10-CM | POA: Diagnosis not present

## 2018-05-07 DIAGNOSIS — N189 Chronic kidney disease, unspecified: Secondary | ICD-10-CM | POA: Diagnosis not present

## 2018-05-07 DIAGNOSIS — E669 Obesity, unspecified: Secondary | ICD-10-CM | POA: Diagnosis not present

## 2018-05-07 DIAGNOSIS — N028 Recurrent and persistent hematuria with other morphologic changes: Secondary | ICD-10-CM | POA: Diagnosis not present

## 2018-05-07 DIAGNOSIS — I10 Essential (primary) hypertension: Secondary | ICD-10-CM | POA: Diagnosis not present

## 2018-05-28 ENCOUNTER — Ambulatory Visit: Payer: BLUE CROSS/BLUE SHIELD | Admitting: Family Medicine

## 2018-06-05 DIAGNOSIS — I129 Hypertensive chronic kidney disease with stage 1 through stage 4 chronic kidney disease, or unspecified chronic kidney disease: Secondary | ICD-10-CM | POA: Diagnosis not present

## 2018-06-05 DIAGNOSIS — N183 Chronic kidney disease, stage 3 (moderate): Secondary | ICD-10-CM | POA: Diagnosis not present

## 2018-06-18 DIAGNOSIS — I10 Essential (primary) hypertension: Secondary | ICD-10-CM | POA: Diagnosis not present

## 2018-06-18 DIAGNOSIS — N390 Urinary tract infection, site not specified: Secondary | ICD-10-CM | POA: Diagnosis not present

## 2018-06-18 DIAGNOSIS — R809 Proteinuria, unspecified: Secondary | ICD-10-CM | POA: Diagnosis not present

## 2018-09-09 DIAGNOSIS — N183 Chronic kidney disease, stage 3 (moderate): Secondary | ICD-10-CM | POA: Diagnosis not present

## 2018-09-09 DIAGNOSIS — I10 Essential (primary) hypertension: Secondary | ICD-10-CM | POA: Diagnosis not present

## 2018-09-10 DIAGNOSIS — R809 Proteinuria, unspecified: Secondary | ICD-10-CM | POA: Diagnosis not present

## 2018-09-10 DIAGNOSIS — I10 Essential (primary) hypertension: Secondary | ICD-10-CM | POA: Diagnosis not present

## 2018-09-10 DIAGNOSIS — N390 Urinary tract infection, site not specified: Secondary | ICD-10-CM | POA: Diagnosis not present

## 2018-11-07 DIAGNOSIS — I10 Essential (primary) hypertension: Secondary | ICD-10-CM | POA: Diagnosis not present

## 2018-11-07 DIAGNOSIS — N183 Chronic kidney disease, stage 3 (moderate): Secondary | ICD-10-CM | POA: Diagnosis not present

## 2018-11-11 DIAGNOSIS — R809 Proteinuria, unspecified: Secondary | ICD-10-CM | POA: Diagnosis not present

## 2018-11-11 DIAGNOSIS — I10 Essential (primary) hypertension: Secondary | ICD-10-CM | POA: Diagnosis not present

## 2018-11-11 DIAGNOSIS — N390 Urinary tract infection, site not specified: Secondary | ICD-10-CM | POA: Diagnosis not present

## 2018-11-25 DIAGNOSIS — R42 Dizziness and giddiness: Secondary | ICD-10-CM | POA: Diagnosis not present

## 2018-12-10 DIAGNOSIS — Z131 Encounter for screening for diabetes mellitus: Secondary | ICD-10-CM | POA: Diagnosis not present

## 2018-12-10 DIAGNOSIS — E538 Deficiency of other specified B group vitamins: Secondary | ICD-10-CM | POA: Diagnosis not present

## 2018-12-10 DIAGNOSIS — E559 Vitamin D deficiency, unspecified: Secondary | ICD-10-CM | POA: Diagnosis not present

## 2018-12-10 DIAGNOSIS — Z1159 Encounter for screening for other viral diseases: Secondary | ICD-10-CM | POA: Diagnosis not present

## 2018-12-10 DIAGNOSIS — R0602 Shortness of breath: Secondary | ICD-10-CM | POA: Diagnosis not present

## 2018-12-10 DIAGNOSIS — D539 Nutritional anemia, unspecified: Secondary | ICD-10-CM | POA: Diagnosis not present

## 2018-12-10 DIAGNOSIS — R5383 Other fatigue: Secondary | ICD-10-CM | POA: Diagnosis not present

## 2018-12-10 DIAGNOSIS — Z0000 Encounter for general adult medical examination without abnormal findings: Secondary | ICD-10-CM | POA: Diagnosis not present

## 2018-12-10 DIAGNOSIS — I10 Essential (primary) hypertension: Secondary | ICD-10-CM | POA: Diagnosis not present

## 2018-12-14 DIAGNOSIS — Z780 Asymptomatic menopausal state: Secondary | ICD-10-CM | POA: Diagnosis not present

## 2018-12-23 DIAGNOSIS — E611 Iron deficiency: Secondary | ICD-10-CM | POA: Diagnosis not present

## 2018-12-23 DIAGNOSIS — R7303 Prediabetes: Secondary | ICD-10-CM | POA: Diagnosis not present

## 2018-12-23 DIAGNOSIS — E559 Vitamin D deficiency, unspecified: Secondary | ICD-10-CM | POA: Diagnosis not present

## 2018-12-23 DIAGNOSIS — E7800 Pure hypercholesterolemia, unspecified: Secondary | ICD-10-CM | POA: Diagnosis not present

## 2018-12-23 DIAGNOSIS — I10 Essential (primary) hypertension: Secondary | ICD-10-CM | POA: Diagnosis not present

## 2018-12-31 DIAGNOSIS — E0510 Thyrotoxicosis with toxic single thyroid nodule without thyrotoxic crisis or storm: Secondary | ICD-10-CM | POA: Diagnosis not present

## 2018-12-31 DIAGNOSIS — R42 Dizziness and giddiness: Secondary | ICD-10-CM | POA: Diagnosis not present

## 2019-01-22 ENCOUNTER — Other Ambulatory Visit: Payer: Self-pay | Admitting: Family Medicine

## 2019-01-22 DIAGNOSIS — I10 Essential (primary) hypertension: Secondary | ICD-10-CM

## 2019-01-22 NOTE — Telephone Encounter (Signed)
Requested medication (s) are due for refill today: no  Requested medication (s) are on the active medication list: no  Last refill:  10/30/2018  Future visit scheduled: no  Notes to clinic: medication was discontinued    Requested Prescriptions  Pending Prescriptions Disp Refills   amLODipine (NORVASC) 10 MG tablet [Pharmacy Med Name: AMLODIPINE BESYLATE 10 MG TAB] 90 tablet 1    Sig: TAKE 1 TABLET (10 MG TOTAL) BY MOUTH DAILY.     Cardiovascular:  Calcium Channel Blockers Failed - 01/22/2019  1:39 AM      Failed - Last BP in normal range    BP Readings from Last 1 Encounters:  04/02/18 (!) 168/102         Failed - Valid encounter within last 6 months    Recent Outpatient Visits          9 months ago Essential hypertension, benign   [facility], Algis Greenhouse, MD   10 months ago Essential hypertension, benign   [facility], Algis Greenhouse, MD   1 year ago Essential hypertension, benign   [facility], Lilia Argue, MD   1 year ago Essential hypertension, benign   [facility], Lilia Argue, MD   2 years ago Encounter for general adult medical examination with abnormal findings   [place], FNP

## 2019-02-10 DIAGNOSIS — N183 Chronic kidney disease, stage 3 unspecified: Secondary | ICD-10-CM | POA: Diagnosis not present

## 2019-02-10 DIAGNOSIS — I10 Essential (primary) hypertension: Secondary | ICD-10-CM | POA: Diagnosis not present

## 2019-02-11 DIAGNOSIS — I10 Essential (primary) hypertension: Secondary | ICD-10-CM | POA: Diagnosis not present

## 2019-02-11 DIAGNOSIS — N184 Chronic kidney disease, stage 4 (severe): Secondary | ICD-10-CM | POA: Diagnosis not present

## 2019-02-11 DIAGNOSIS — R809 Proteinuria, unspecified: Secondary | ICD-10-CM | POA: Diagnosis not present

## 2019-02-11 DIAGNOSIS — N390 Urinary tract infection, site not specified: Secondary | ICD-10-CM | POA: Diagnosis not present

## 2019-03-29 DIAGNOSIS — Z20828 Contact with and (suspected) exposure to other viral communicable diseases: Secondary | ICD-10-CM | POA: Diagnosis not present

## 2019-05-01 ENCOUNTER — Other Ambulatory Visit: Payer: Self-pay | Admitting: Endocrinology

## 2019-05-01 DIAGNOSIS — Z1231 Encounter for screening mammogram for malignant neoplasm of breast: Secondary | ICD-10-CM

## 2019-05-13 DIAGNOSIS — E559 Vitamin D deficiency, unspecified: Secondary | ICD-10-CM | POA: Diagnosis not present

## 2019-05-13 DIAGNOSIS — M25512 Pain in left shoulder: Secondary | ICD-10-CM | POA: Diagnosis not present

## 2019-05-13 DIAGNOSIS — R072 Precordial pain: Secondary | ICD-10-CM | POA: Diagnosis not present

## 2019-05-13 DIAGNOSIS — M810 Age-related osteoporosis without current pathological fracture: Secondary | ICD-10-CM | POA: Diagnosis not present

## 2019-05-27 ENCOUNTER — Other Ambulatory Visit: Payer: Self-pay

## 2019-05-27 ENCOUNTER — Ambulatory Visit
Admission: RE | Admit: 2019-05-27 | Discharge: 2019-05-27 | Disposition: A | Discharge status: Home / Self Care | Payer: BLUE CROSS/BLUE SHIELD | Source: Ambulatory Visit | Attending: Endocrinology | Admitting: Endocrinology

## 2019-05-27 DIAGNOSIS — Z1231 Encounter for screening mammogram for malignant neoplasm of breast: Secondary | ICD-10-CM

## 2019-05-28 DIAGNOSIS — I10 Essential (primary) hypertension: Secondary | ICD-10-CM | POA: Diagnosis not present

## 2019-05-28 DIAGNOSIS — N183 Chronic kidney disease, stage 3 unspecified: Secondary | ICD-10-CM | POA: Diagnosis not present

## 2019-05-28 DIAGNOSIS — R809 Proteinuria, unspecified: Secondary | ICD-10-CM | POA: Diagnosis not present

## 2019-05-28 DIAGNOSIS — N390 Urinary tract infection, site not specified: Secondary | ICD-10-CM | POA: Diagnosis not present

## 2019-06-10 ENCOUNTER — Ambulatory Visit: Payer: BLUE CROSS/BLUE SHIELD

## 2019-06-11 DIAGNOSIS — K2100 Gastro-esophageal reflux disease with esophagitis, without bleeding: Secondary | ICD-10-CM | POA: Diagnosis not present

## 2019-06-11 DIAGNOSIS — I120 Hypertensive chronic kidney disease with stage 5 chronic kidney disease or end stage renal disease: Secondary | ICD-10-CM | POA: Diagnosis not present

## 2019-06-11 DIAGNOSIS — R635 Abnormal weight gain: Secondary | ICD-10-CM | POA: Diagnosis not present

## 2019-06-11 DIAGNOSIS — F3289 Other specified depressive episodes: Secondary | ICD-10-CM | POA: Diagnosis not present

## 2019-06-11 DIAGNOSIS — Z833 Family history of diabetes mellitus: Secondary | ICD-10-CM | POA: Diagnosis not present

## 2019-07-02 DIAGNOSIS — K2100 Gastro-esophageal reflux disease with esophagitis, without bleeding: Secondary | ICD-10-CM | POA: Diagnosis not present

## 2019-07-16 DIAGNOSIS — Z23 Encounter for immunization: Secondary | ICD-10-CM | POA: Diagnosis not present

## 2019-07-23 DIAGNOSIS — I120 Hypertensive chronic kidney disease with stage 5 chronic kidney disease or end stage renal disease: Secondary | ICD-10-CM | POA: Diagnosis not present

## 2019-07-23 DIAGNOSIS — F3289 Other specified depressive episodes: Secondary | ICD-10-CM | POA: Diagnosis not present

## 2019-07-23 DIAGNOSIS — E6609 Other obesity due to excess calories: Secondary | ICD-10-CM | POA: Diagnosis not present

## 2019-07-23 DIAGNOSIS — I10 Essential (primary) hypertension: Secondary | ICD-10-CM | POA: Diagnosis not present

## 2019-07-23 DIAGNOSIS — N184 Chronic kidney disease, stage 4 (severe): Secondary | ICD-10-CM | POA: Diagnosis not present

## 2019-07-23 DIAGNOSIS — E1169 Type 2 diabetes mellitus with other specified complication: Secondary | ICD-10-CM | POA: Diagnosis not present

## 2019-07-23 DIAGNOSIS — Z833 Family history of diabetes mellitus: Secondary | ICD-10-CM | POA: Diagnosis not present

## 2019-07-24 DIAGNOSIS — N390 Urinary tract infection, site not specified: Secondary | ICD-10-CM | POA: Diagnosis not present

## 2019-07-24 DIAGNOSIS — R809 Proteinuria, unspecified: Secondary | ICD-10-CM | POA: Diagnosis not present

## 2019-07-24 DIAGNOSIS — I10 Essential (primary) hypertension: Secondary | ICD-10-CM | POA: Diagnosis not present

## 2019-08-06 DIAGNOSIS — Z23 Encounter for immunization: Secondary | ICD-10-CM | POA: Diagnosis not present

## 2019-08-25 DIAGNOSIS — N183 Chronic kidney disease, stage 3 unspecified: Secondary | ICD-10-CM | POA: Diagnosis not present

## 2019-08-25 DIAGNOSIS — N390 Urinary tract infection, site not specified: Secondary | ICD-10-CM | POA: Diagnosis not present

## 2019-08-25 DIAGNOSIS — I10 Essential (primary) hypertension: Secondary | ICD-10-CM | POA: Diagnosis not present

## 2019-10-20 DIAGNOSIS — K591 Functional diarrhea: Secondary | ICD-10-CM | POA: Diagnosis not present

## 2019-10-23 DIAGNOSIS — R1030 Lower abdominal pain, unspecified: Secondary | ICD-10-CM | POA: Diagnosis not present

## 2019-10-23 DIAGNOSIS — N289 Disorder of kidney and ureter, unspecified: Secondary | ICD-10-CM | POA: Diagnosis not present

## 2019-10-24 DIAGNOSIS — K6389 Other specified diseases of intestine: Secondary | ICD-10-CM | POA: Diagnosis not present

## 2019-10-24 DIAGNOSIS — K529 Noninfective gastroenteritis and colitis, unspecified: Secondary | ICD-10-CM | POA: Diagnosis not present

## 2019-10-24 DIAGNOSIS — I701 Atherosclerosis of renal artery: Secondary | ICD-10-CM | POA: Diagnosis not present

## 2019-10-24 DIAGNOSIS — N281 Cyst of kidney, acquired: Secondary | ICD-10-CM | POA: Diagnosis not present

## 2019-10-24 DIAGNOSIS — R1030 Lower abdominal pain, unspecified: Secondary | ICD-10-CM | POA: Diagnosis not present

## 2019-10-29 DIAGNOSIS — N390 Urinary tract infection, site not specified: Secondary | ICD-10-CM | POA: Diagnosis not present

## 2019-10-29 DIAGNOSIS — N183 Chronic kidney disease, stage 3 unspecified: Secondary | ICD-10-CM | POA: Diagnosis not present

## 2019-10-29 DIAGNOSIS — I10 Essential (primary) hypertension: Secondary | ICD-10-CM | POA: Diagnosis not present

## 2019-11-18 DIAGNOSIS — H1830 Unspecified corneal membrane change: Secondary | ICD-10-CM | POA: Diagnosis not present

## 2019-11-19 DIAGNOSIS — N390 Urinary tract infection, site not specified: Secondary | ICD-10-CM | POA: Diagnosis not present

## 2019-11-19 DIAGNOSIS — I10 Essential (primary) hypertension: Secondary | ICD-10-CM | POA: Diagnosis not present

## 2019-11-19 DIAGNOSIS — N183 Chronic kidney disease, stage 3 unspecified: Secondary | ICD-10-CM | POA: Diagnosis not present

## 2019-11-19 DIAGNOSIS — R809 Proteinuria, unspecified: Secondary | ICD-10-CM | POA: Diagnosis not present

## 2019-12-24 DIAGNOSIS — N184 Chronic kidney disease, stage 4 (severe): Secondary | ICD-10-CM | POA: Diagnosis not present

## 2019-12-24 DIAGNOSIS — I10 Essential (primary) hypertension: Secondary | ICD-10-CM | POA: Diagnosis not present

## 2019-12-24 DIAGNOSIS — N390 Urinary tract infection, site not specified: Secondary | ICD-10-CM | POA: Diagnosis not present

## 2019-12-24 DIAGNOSIS — R809 Proteinuria, unspecified: Secondary | ICD-10-CM | POA: Diagnosis not present

## 2020-01-14 DIAGNOSIS — R609 Edema, unspecified: Secondary | ICD-10-CM | POA: Diagnosis not present

## 2020-02-10 ENCOUNTER — Other Ambulatory Visit: Payer: Self-pay | Admitting: Family Medicine

## 2020-02-10 DIAGNOSIS — N63 Unspecified lump in unspecified breast: Secondary | ICD-10-CM

## 2020-02-10 DIAGNOSIS — N189 Chronic kidney disease, unspecified: Secondary | ICD-10-CM | POA: Diagnosis not present

## 2020-02-10 DIAGNOSIS — I10 Essential (primary) hypertension: Secondary | ICD-10-CM | POA: Diagnosis not present

## 2020-02-12 DIAGNOSIS — N390 Urinary tract infection, site not specified: Secondary | ICD-10-CM | POA: Diagnosis not present

## 2020-02-12 DIAGNOSIS — I10 Essential (primary) hypertension: Secondary | ICD-10-CM | POA: Diagnosis not present

## 2020-02-12 DIAGNOSIS — N184 Chronic kidney disease, stage 4 (severe): Secondary | ICD-10-CM | POA: Diagnosis not present

## 2020-02-12 DIAGNOSIS — N183 Chronic kidney disease, stage 3 unspecified: Secondary | ICD-10-CM | POA: Diagnosis not present

## 2020-02-26 ENCOUNTER — Ambulatory Visit
Admission: RE | Admit: 2020-02-26 | Discharge: 2020-02-26 | Disposition: A | Discharge status: Home / Self Care | Payer: BC Managed Care – PPO | Source: Ambulatory Visit | Attending: Family Medicine | Admitting: Family Medicine

## 2020-02-26 ENCOUNTER — Other Ambulatory Visit: Payer: Self-pay | Admitting: Family Medicine

## 2020-02-26 ENCOUNTER — Other Ambulatory Visit: Payer: Self-pay

## 2020-02-26 DIAGNOSIS — R928 Other abnormal and inconclusive findings on diagnostic imaging of breast: Secondary | ICD-10-CM | POA: Diagnosis not present

## 2020-02-26 DIAGNOSIS — N63 Unspecified lump in unspecified breast: Secondary | ICD-10-CM

## 2020-02-26 DIAGNOSIS — N644 Mastodynia: Secondary | ICD-10-CM | POA: Diagnosis not present

## 2020-02-28 ENCOUNTER — Other Ambulatory Visit: Payer: Self-pay

## 2020-02-28 ENCOUNTER — Ambulatory Visit (HOSPITAL_COMMUNITY)
Admission: EM | Admit: 2020-02-28 | Discharge: 2020-02-28 | Disposition: A | Discharge status: Home / Self Care | Payer: BC Managed Care – PPO | Attending: Family Medicine | Admitting: Family Medicine

## 2020-02-28 ENCOUNTER — Emergency Department (HOSPITAL_COMMUNITY)
Admission: EM | Admit: 2020-02-28 | Discharge: 2020-02-28 | Discharge status: Against Medical Advice | Payer: BC Managed Care – PPO

## 2020-02-28 DIAGNOSIS — R079 Chest pain, unspecified: Secondary | ICD-10-CM | POA: Diagnosis not present

## 2020-02-28 NOTE — ED Notes (Signed)
Pt called to triage x4. No response.

## 2020-02-28 NOTE — ED Triage Notes (Signed)
Pt c/o left sternal CP "dull and fluttery" in nature for approx 1 week. Pt states pain initially started while she was carrying a comforter and other items in her left hand. Pain is not reproducible on palpation, does not change with movement or inspiration. Also c/o pain to left trapezius muscle area. Pt rates 3/10 scale.   Denies SOB, nausea, diaphoresis, weakness, dizziness, pain radiating to back, jaw, arms.  EKG performed and given to K. Hall Busing, NP. Raliegh Ip. Tate in to triage for eval and advised pt to go to ED STAT for further eval. Pt declined EMS transport and opted for daughter to drive her.  Pt reports she has not taken her BP meds today. Pt took BP meds with sip water while in triage. Patient is being discharged from the Urgent Care and sent to the Emergency Department via POV . Per K. Hall Busing, NP, patient is in need of higher level of care due to CP Patient is aware and verbalizes understanding of plan of care.  Vitals:   02/28/20 1546  BP: (!) 190/103  Pulse: 96  Resp: 16  Temp: 98.2 F (36.8 C)  SpO2: 100%

## 2020-02-28 NOTE — ED Notes (Signed)
Pt is not in ED lobby.  Called several times and SORT EMT states that he believes pt left.

## 2020-04-05 DIAGNOSIS — N184 Chronic kidney disease, stage 4 (severe): Secondary | ICD-10-CM | POA: Diagnosis not present

## 2020-04-05 DIAGNOSIS — I10 Essential (primary) hypertension: Secondary | ICD-10-CM | POA: Diagnosis not present

## 2020-04-05 DIAGNOSIS — E213 Hyperparathyroidism, unspecified: Secondary | ICD-10-CM | POA: Diagnosis not present

## 2020-04-06 DIAGNOSIS — R809 Proteinuria, unspecified: Secondary | ICD-10-CM | POA: Diagnosis not present

## 2020-04-06 DIAGNOSIS — N184 Chronic kidney disease, stage 4 (severe): Secondary | ICD-10-CM | POA: Diagnosis not present

## 2020-04-06 DIAGNOSIS — N390 Urinary tract infection, site not specified: Secondary | ICD-10-CM | POA: Diagnosis not present

## 2020-04-06 DIAGNOSIS — I10 Essential (primary) hypertension: Secondary | ICD-10-CM | POA: Diagnosis not present

## 2020-05-06 DIAGNOSIS — I10 Essential (primary) hypertension: Secondary | ICD-10-CM | POA: Diagnosis not present

## 2020-05-06 DIAGNOSIS — N184 Chronic kidney disease, stage 4 (severe): Secondary | ICD-10-CM | POA: Diagnosis not present

## 2020-05-06 DIAGNOSIS — E213 Hyperparathyroidism, unspecified: Secondary | ICD-10-CM | POA: Diagnosis not present

## 2020-05-07 DIAGNOSIS — I10 Essential (primary) hypertension: Secondary | ICD-10-CM | POA: Diagnosis not present

## 2020-05-07 DIAGNOSIS — N390 Urinary tract infection, site not specified: Secondary | ICD-10-CM | POA: Diagnosis not present

## 2020-05-07 DIAGNOSIS — N184 Chronic kidney disease, stage 4 (severe): Secondary | ICD-10-CM | POA: Diagnosis not present

## 2020-05-07 DIAGNOSIS — R809 Proteinuria, unspecified: Secondary | ICD-10-CM | POA: Diagnosis not present

## 2020-07-07 DIAGNOSIS — I10 Essential (primary) hypertension: Secondary | ICD-10-CM | POA: Diagnosis not present

## 2020-07-07 DIAGNOSIS — D631 Anemia in chronic kidney disease: Secondary | ICD-10-CM | POA: Diagnosis not present

## 2020-07-07 DIAGNOSIS — N184 Chronic kidney disease, stage 4 (severe): Secondary | ICD-10-CM | POA: Diagnosis not present

## 2020-07-07 DIAGNOSIS — D518 Other vitamin B12 deficiency anemias: Secondary | ICD-10-CM | POA: Diagnosis not present

## 2020-07-07 DIAGNOSIS — N390 Urinary tract infection, site not specified: Secondary | ICD-10-CM | POA: Diagnosis not present

## 2020-07-07 DIAGNOSIS — D509 Iron deficiency anemia, unspecified: Secondary | ICD-10-CM | POA: Diagnosis not present

## 2020-08-24 DIAGNOSIS — E213 Hyperparathyroidism, unspecified: Secondary | ICD-10-CM | POA: Diagnosis not present

## 2020-08-24 DIAGNOSIS — D518 Other vitamin B12 deficiency anemias: Secondary | ICD-10-CM | POA: Diagnosis not present

## 2020-08-24 DIAGNOSIS — D631 Anemia in chronic kidney disease: Secondary | ICD-10-CM | POA: Diagnosis not present

## 2020-08-24 DIAGNOSIS — N184 Chronic kidney disease, stage 4 (severe): Secondary | ICD-10-CM | POA: Diagnosis not present

## 2020-09-15 DIAGNOSIS — I10 Essential (primary) hypertension: Secondary | ICD-10-CM | POA: Diagnosis not present

## 2020-09-15 DIAGNOSIS — D638 Anemia in other chronic diseases classified elsewhere: Secondary | ICD-10-CM | POA: Diagnosis not present

## 2020-09-15 DIAGNOSIS — N184 Chronic kidney disease, stage 4 (severe): Secondary | ICD-10-CM | POA: Diagnosis not present

## 2020-09-17 DIAGNOSIS — R809 Proteinuria, unspecified: Secondary | ICD-10-CM | POA: Diagnosis not present

## 2020-09-17 DIAGNOSIS — I10 Essential (primary) hypertension: Secondary | ICD-10-CM | POA: Diagnosis not present

## 2020-09-29 ENCOUNTER — Ambulatory Visit
Admission: RE | Admit: 2020-09-29 | Discharge: 2020-09-29 | Disposition: A | Discharge status: Home / Self Care | Payer: BC Managed Care – PPO | Source: Ambulatory Visit | Attending: Internal Medicine | Admitting: Internal Medicine

## 2020-09-29 ENCOUNTER — Other Ambulatory Visit: Payer: Self-pay

## 2020-09-29 ENCOUNTER — Other Ambulatory Visit: Payer: Self-pay | Admitting: Internal Medicine

## 2020-09-29 DIAGNOSIS — M7989 Other specified soft tissue disorders: Secondary | ICD-10-CM | POA: Diagnosis not present

## 2020-09-29 DIAGNOSIS — M79671 Pain in right foot: Secondary | ICD-10-CM

## 2020-09-29 DIAGNOSIS — M7731 Calcaneal spur, right foot: Secondary | ICD-10-CM | POA: Diagnosis not present

## 2020-09-29 DIAGNOSIS — N186 End stage renal disease: Secondary | ICD-10-CM | POA: Diagnosis not present

## 2020-10-04 DIAGNOSIS — D509 Iron deficiency anemia, unspecified: Secondary | ICD-10-CM | POA: Diagnosis not present

## 2020-10-04 DIAGNOSIS — E213 Hyperparathyroidism, unspecified: Secondary | ICD-10-CM | POA: Diagnosis not present

## 2020-10-04 DIAGNOSIS — D631 Anemia in chronic kidney disease: Secondary | ICD-10-CM | POA: Diagnosis not present

## 2020-10-04 DIAGNOSIS — D518 Other vitamin B12 deficiency anemias: Secondary | ICD-10-CM | POA: Diagnosis not present

## 2020-10-05 DIAGNOSIS — I10 Essential (primary) hypertension: Secondary | ICD-10-CM | POA: Diagnosis not present

## 2020-10-05 DIAGNOSIS — R809 Proteinuria, unspecified: Secondary | ICD-10-CM | POA: Diagnosis not present

## 2020-11-03 DIAGNOSIS — I10 Essential (primary) hypertension: Secondary | ICD-10-CM | POA: Diagnosis not present

## 2020-11-03 DIAGNOSIS — Z0000 Encounter for general adult medical examination without abnormal findings: Secondary | ICD-10-CM | POA: Diagnosis not present

## 2020-11-03 DIAGNOSIS — Z13220 Encounter for screening for lipoid disorders: Secondary | ICD-10-CM | POA: Diagnosis not present

## 2020-11-03 DIAGNOSIS — N186 End stage renal disease: Secondary | ICD-10-CM | POA: Diagnosis not present

## 2020-11-03 DIAGNOSIS — D638 Anemia in other chronic diseases classified elsewhere: Secondary | ICD-10-CM | POA: Diagnosis not present

## 2020-11-05 DIAGNOSIS — E669 Obesity, unspecified: Secondary | ICD-10-CM | POA: Diagnosis not present

## 2020-11-05 DIAGNOSIS — I120 Hypertensive chronic kidney disease with stage 5 chronic kidney disease or end stage renal disease: Secondary | ICD-10-CM | POA: Diagnosis not present

## 2020-11-05 DIAGNOSIS — N186 End stage renal disease: Secondary | ICD-10-CM | POA: Diagnosis not present

## 2020-11-05 DIAGNOSIS — Z6833 Body mass index (BMI) 33.0-33.9, adult: Secondary | ICD-10-CM | POA: Diagnosis not present

## 2020-11-10 DIAGNOSIS — N390 Urinary tract infection, site not specified: Secondary | ICD-10-CM | POA: Diagnosis not present

## 2020-11-10 DIAGNOSIS — I10 Essential (primary) hypertension: Secondary | ICD-10-CM | POA: Diagnosis not present

## 2020-11-10 DIAGNOSIS — R809 Proteinuria, unspecified: Secondary | ICD-10-CM | POA: Diagnosis not present

## 2020-11-22 DIAGNOSIS — I120 Hypertensive chronic kidney disease with stage 5 chronic kidney disease or end stage renal disease: Secondary | ICD-10-CM | POA: Diagnosis not present

## 2020-11-22 DIAGNOSIS — I10 Essential (primary) hypertension: Secondary | ICD-10-CM | POA: Diagnosis not present

## 2020-11-22 DIAGNOSIS — Z8673 Personal history of transient ischemic attack (TIA), and cerebral infarction without residual deficits: Secondary | ICD-10-CM | POA: Diagnosis not present

## 2020-11-22 DIAGNOSIS — N186 End stage renal disease: Secondary | ICD-10-CM | POA: Diagnosis not present

## 2020-11-22 DIAGNOSIS — D631 Anemia in chronic kidney disease: Secondary | ICD-10-CM | POA: Diagnosis not present

## 2020-11-22 DIAGNOSIS — Z7982 Long term (current) use of aspirin: Secondary | ICD-10-CM | POA: Diagnosis not present

## 2020-11-22 DIAGNOSIS — R7989 Other specified abnormal findings of blood chemistry: Secondary | ICD-10-CM | POA: Diagnosis not present

## 2020-11-22 DIAGNOSIS — I6782 Cerebral ischemia: Secondary | ICD-10-CM | POA: Diagnosis not present

## 2020-11-22 DIAGNOSIS — K429 Umbilical hernia without obstruction or gangrene: Secondary | ICD-10-CM | POA: Diagnosis not present

## 2020-11-22 DIAGNOSIS — Z01810 Encounter for preprocedural cardiovascular examination: Secondary | ICD-10-CM | POA: Diagnosis not present

## 2020-11-22 DIAGNOSIS — Z992 Dependence on renal dialysis: Secondary | ICD-10-CM | POA: Diagnosis not present

## 2020-11-25 DIAGNOSIS — Z992 Dependence on renal dialysis: Secondary | ICD-10-CM | POA: Diagnosis not present

## 2020-11-25 DIAGNOSIS — N186 End stage renal disease: Secondary | ICD-10-CM | POA: Diagnosis not present

## 2020-11-26 DIAGNOSIS — D631 Anemia in chronic kidney disease: Secondary | ICD-10-CM | POA: Diagnosis not present

## 2020-11-26 DIAGNOSIS — N2581 Secondary hyperparathyroidism of renal origin: Secondary | ICD-10-CM | POA: Diagnosis not present

## 2020-11-26 DIAGNOSIS — N186 End stage renal disease: Secondary | ICD-10-CM | POA: Diagnosis not present

## 2020-12-06 DIAGNOSIS — N2581 Secondary hyperparathyroidism of renal origin: Secondary | ICD-10-CM | POA: Diagnosis not present

## 2020-12-06 DIAGNOSIS — D631 Anemia in chronic kidney disease: Secondary | ICD-10-CM | POA: Diagnosis not present

## 2020-12-06 DIAGNOSIS — N186 End stage renal disease: Secondary | ICD-10-CM | POA: Diagnosis not present

## 2020-12-07 DIAGNOSIS — N186 End stage renal disease: Secondary | ICD-10-CM | POA: Diagnosis not present

## 2020-12-07 DIAGNOSIS — N2581 Secondary hyperparathyroidism of renal origin: Secondary | ICD-10-CM | POA: Diagnosis not present

## 2020-12-07 DIAGNOSIS — D631 Anemia in chronic kidney disease: Secondary | ICD-10-CM | POA: Diagnosis not present

## 2020-12-08 DIAGNOSIS — N2581 Secondary hyperparathyroidism of renal origin: Secondary | ICD-10-CM | POA: Diagnosis not present

## 2020-12-08 DIAGNOSIS — D631 Anemia in chronic kidney disease: Secondary | ICD-10-CM | POA: Diagnosis not present

## 2020-12-08 DIAGNOSIS — N186 End stage renal disease: Secondary | ICD-10-CM | POA: Diagnosis not present

## 2020-12-09 DIAGNOSIS — D631 Anemia in chronic kidney disease: Secondary | ICD-10-CM | POA: Diagnosis not present

## 2020-12-09 DIAGNOSIS — N2581 Secondary hyperparathyroidism of renal origin: Secondary | ICD-10-CM | POA: Diagnosis not present

## 2020-12-09 DIAGNOSIS — N186 End stage renal disease: Secondary | ICD-10-CM | POA: Diagnosis not present

## 2020-12-10 DIAGNOSIS — N186 End stage renal disease: Secondary | ICD-10-CM | POA: Diagnosis not present

## 2020-12-10 DIAGNOSIS — D631 Anemia in chronic kidney disease: Secondary | ICD-10-CM | POA: Diagnosis not present

## 2020-12-10 DIAGNOSIS — N2581 Secondary hyperparathyroidism of renal origin: Secondary | ICD-10-CM | POA: Diagnosis not present

## 2020-12-11 DIAGNOSIS — D631 Anemia in chronic kidney disease: Secondary | ICD-10-CM | POA: Diagnosis not present

## 2020-12-11 DIAGNOSIS — D509 Iron deficiency anemia, unspecified: Secondary | ICD-10-CM | POA: Diagnosis not present

## 2020-12-11 DIAGNOSIS — N2581 Secondary hyperparathyroidism of renal origin: Secondary | ICD-10-CM | POA: Diagnosis not present

## 2020-12-11 DIAGNOSIS — N186 End stage renal disease: Secondary | ICD-10-CM | POA: Diagnosis not present

## 2020-12-12 DIAGNOSIS — D631 Anemia in chronic kidney disease: Secondary | ICD-10-CM | POA: Diagnosis not present

## 2020-12-12 DIAGNOSIS — N2581 Secondary hyperparathyroidism of renal origin: Secondary | ICD-10-CM | POA: Diagnosis not present

## 2020-12-12 DIAGNOSIS — N186 End stage renal disease: Secondary | ICD-10-CM | POA: Diagnosis not present

## 2020-12-12 DIAGNOSIS — D509 Iron deficiency anemia, unspecified: Secondary | ICD-10-CM | POA: Diagnosis not present

## 2020-12-13 DIAGNOSIS — N2581 Secondary hyperparathyroidism of renal origin: Secondary | ICD-10-CM | POA: Diagnosis not present

## 2020-12-13 DIAGNOSIS — D631 Anemia in chronic kidney disease: Secondary | ICD-10-CM | POA: Diagnosis not present

## 2020-12-13 DIAGNOSIS — N186 End stage renal disease: Secondary | ICD-10-CM | POA: Diagnosis not present

## 2020-12-13 DIAGNOSIS — D509 Iron deficiency anemia, unspecified: Secondary | ICD-10-CM | POA: Diagnosis not present

## 2020-12-14 DIAGNOSIS — N186 End stage renal disease: Secondary | ICD-10-CM | POA: Diagnosis not present

## 2020-12-14 DIAGNOSIS — N2581 Secondary hyperparathyroidism of renal origin: Secondary | ICD-10-CM | POA: Diagnosis not present

## 2020-12-14 DIAGNOSIS — D509 Iron deficiency anemia, unspecified: Secondary | ICD-10-CM | POA: Diagnosis not present

## 2020-12-14 DIAGNOSIS — D631 Anemia in chronic kidney disease: Secondary | ICD-10-CM | POA: Diagnosis not present

## 2020-12-15 DIAGNOSIS — D631 Anemia in chronic kidney disease: Secondary | ICD-10-CM | POA: Diagnosis not present

## 2020-12-15 DIAGNOSIS — D509 Iron deficiency anemia, unspecified: Secondary | ICD-10-CM | POA: Diagnosis not present

## 2020-12-15 DIAGNOSIS — N2581 Secondary hyperparathyroidism of renal origin: Secondary | ICD-10-CM | POA: Diagnosis not present

## 2020-12-15 DIAGNOSIS — N186 End stage renal disease: Secondary | ICD-10-CM | POA: Diagnosis not present

## 2020-12-16 DIAGNOSIS — D631 Anemia in chronic kidney disease: Secondary | ICD-10-CM | POA: Diagnosis not present

## 2020-12-16 DIAGNOSIS — N2581 Secondary hyperparathyroidism of renal origin: Secondary | ICD-10-CM | POA: Diagnosis not present

## 2020-12-16 DIAGNOSIS — N186 End stage renal disease: Secondary | ICD-10-CM | POA: Diagnosis not present

## 2020-12-16 DIAGNOSIS — D509 Iron deficiency anemia, unspecified: Secondary | ICD-10-CM | POA: Diagnosis not present

## 2020-12-17 DIAGNOSIS — D631 Anemia in chronic kidney disease: Secondary | ICD-10-CM | POA: Diagnosis not present

## 2020-12-17 DIAGNOSIS — N186 End stage renal disease: Secondary | ICD-10-CM | POA: Diagnosis not present

## 2020-12-17 DIAGNOSIS — N2581 Secondary hyperparathyroidism of renal origin: Secondary | ICD-10-CM | POA: Diagnosis not present

## 2020-12-17 DIAGNOSIS — D509 Iron deficiency anemia, unspecified: Secondary | ICD-10-CM | POA: Diagnosis not present

## 2020-12-18 DIAGNOSIS — D509 Iron deficiency anemia, unspecified: Secondary | ICD-10-CM | POA: Diagnosis not present

## 2020-12-18 DIAGNOSIS — N186 End stage renal disease: Secondary | ICD-10-CM | POA: Diagnosis not present

## 2020-12-18 DIAGNOSIS — D631 Anemia in chronic kidney disease: Secondary | ICD-10-CM | POA: Diagnosis not present

## 2020-12-18 DIAGNOSIS — N2581 Secondary hyperparathyroidism of renal origin: Secondary | ICD-10-CM | POA: Diagnosis not present

## 2020-12-19 DIAGNOSIS — D631 Anemia in chronic kidney disease: Secondary | ICD-10-CM | POA: Diagnosis not present

## 2020-12-19 DIAGNOSIS — N2581 Secondary hyperparathyroidism of renal origin: Secondary | ICD-10-CM | POA: Diagnosis not present

## 2020-12-19 DIAGNOSIS — D509 Iron deficiency anemia, unspecified: Secondary | ICD-10-CM | POA: Diagnosis not present

## 2020-12-19 DIAGNOSIS — N186 End stage renal disease: Secondary | ICD-10-CM | POA: Diagnosis not present

## 2020-12-20 DIAGNOSIS — N2581 Secondary hyperparathyroidism of renal origin: Secondary | ICD-10-CM | POA: Diagnosis not present

## 2020-12-20 DIAGNOSIS — D509 Iron deficiency anemia, unspecified: Secondary | ICD-10-CM | POA: Diagnosis not present

## 2020-12-20 DIAGNOSIS — N186 End stage renal disease: Secondary | ICD-10-CM | POA: Diagnosis not present

## 2020-12-20 DIAGNOSIS — D631 Anemia in chronic kidney disease: Secondary | ICD-10-CM | POA: Diagnosis not present

## 2020-12-21 DIAGNOSIS — D631 Anemia in chronic kidney disease: Secondary | ICD-10-CM | POA: Diagnosis not present

## 2020-12-21 DIAGNOSIS — N2581 Secondary hyperparathyroidism of renal origin: Secondary | ICD-10-CM | POA: Diagnosis not present

## 2020-12-21 DIAGNOSIS — N186 End stage renal disease: Secondary | ICD-10-CM | POA: Diagnosis not present

## 2020-12-21 DIAGNOSIS — D509 Iron deficiency anemia, unspecified: Secondary | ICD-10-CM | POA: Diagnosis not present

## 2020-12-22 DIAGNOSIS — N2581 Secondary hyperparathyroidism of renal origin: Secondary | ICD-10-CM | POA: Diagnosis not present

## 2020-12-22 DIAGNOSIS — D631 Anemia in chronic kidney disease: Secondary | ICD-10-CM | POA: Diagnosis not present

## 2020-12-22 DIAGNOSIS — D509 Iron deficiency anemia, unspecified: Secondary | ICD-10-CM | POA: Diagnosis not present

## 2020-12-22 DIAGNOSIS — N186 End stage renal disease: Secondary | ICD-10-CM | POA: Diagnosis not present

## 2020-12-23 DIAGNOSIS — N186 End stage renal disease: Secondary | ICD-10-CM | POA: Diagnosis not present

## 2020-12-23 DIAGNOSIS — D509 Iron deficiency anemia, unspecified: Secondary | ICD-10-CM | POA: Diagnosis not present

## 2020-12-23 DIAGNOSIS — I10 Essential (primary) hypertension: Secondary | ICD-10-CM | POA: Diagnosis not present

## 2020-12-23 DIAGNOSIS — D631 Anemia in chronic kidney disease: Secondary | ICD-10-CM | POA: Diagnosis not present

## 2020-12-23 DIAGNOSIS — N2581 Secondary hyperparathyroidism of renal origin: Secondary | ICD-10-CM | POA: Diagnosis not present

## 2020-12-23 DIAGNOSIS — D649 Anemia, unspecified: Secondary | ICD-10-CM | POA: Diagnosis not present

## 2020-12-24 DIAGNOSIS — D509 Iron deficiency anemia, unspecified: Secondary | ICD-10-CM | POA: Diagnosis not present

## 2020-12-24 DIAGNOSIS — N2581 Secondary hyperparathyroidism of renal origin: Secondary | ICD-10-CM | POA: Diagnosis not present

## 2020-12-24 DIAGNOSIS — N186 End stage renal disease: Secondary | ICD-10-CM | POA: Diagnosis not present

## 2020-12-24 DIAGNOSIS — D631 Anemia in chronic kidney disease: Secondary | ICD-10-CM | POA: Diagnosis not present

## 2020-12-25 DIAGNOSIS — D509 Iron deficiency anemia, unspecified: Secondary | ICD-10-CM | POA: Diagnosis not present

## 2020-12-25 DIAGNOSIS — N186 End stage renal disease: Secondary | ICD-10-CM | POA: Diagnosis not present

## 2020-12-25 DIAGNOSIS — N2581 Secondary hyperparathyroidism of renal origin: Secondary | ICD-10-CM | POA: Diagnosis not present

## 2020-12-25 DIAGNOSIS — D631 Anemia in chronic kidney disease: Secondary | ICD-10-CM | POA: Diagnosis not present

## 2020-12-26 DIAGNOSIS — N2581 Secondary hyperparathyroidism of renal origin: Secondary | ICD-10-CM | POA: Diagnosis not present

## 2020-12-26 DIAGNOSIS — D631 Anemia in chronic kidney disease: Secondary | ICD-10-CM | POA: Diagnosis not present

## 2020-12-26 DIAGNOSIS — N186 End stage renal disease: Secondary | ICD-10-CM | POA: Diagnosis not present

## 2020-12-26 DIAGNOSIS — D509 Iron deficiency anemia, unspecified: Secondary | ICD-10-CM | POA: Diagnosis not present

## 2020-12-27 DIAGNOSIS — D631 Anemia in chronic kidney disease: Secondary | ICD-10-CM | POA: Diagnosis not present

## 2020-12-27 DIAGNOSIS — N186 End stage renal disease: Secondary | ICD-10-CM | POA: Diagnosis not present

## 2020-12-27 DIAGNOSIS — D509 Iron deficiency anemia, unspecified: Secondary | ICD-10-CM | POA: Diagnosis not present

## 2020-12-27 DIAGNOSIS — N2581 Secondary hyperparathyroidism of renal origin: Secondary | ICD-10-CM | POA: Diagnosis not present

## 2020-12-28 DIAGNOSIS — N186 End stage renal disease: Secondary | ICD-10-CM | POA: Diagnosis not present

## 2020-12-28 DIAGNOSIS — D509 Iron deficiency anemia, unspecified: Secondary | ICD-10-CM | POA: Diagnosis not present

## 2020-12-28 DIAGNOSIS — N2581 Secondary hyperparathyroidism of renal origin: Secondary | ICD-10-CM | POA: Diagnosis not present

## 2020-12-28 DIAGNOSIS — D631 Anemia in chronic kidney disease: Secondary | ICD-10-CM | POA: Diagnosis not present

## 2020-12-29 DIAGNOSIS — Y999 Unspecified external cause status: Secondary | ICD-10-CM | POA: Diagnosis not present

## 2020-12-29 DIAGNOSIS — T8249XA Other complication of vascular dialysis catheter, initial encounter: Secondary | ICD-10-CM | POA: Diagnosis not present

## 2020-12-29 DIAGNOSIS — T85611A Breakdown (mechanical) of intraperitoneal dialysis catheter, initial encounter: Secondary | ICD-10-CM | POA: Diagnosis not present

## 2020-12-29 DIAGNOSIS — I120 Hypertensive chronic kidney disease with stage 5 chronic kidney disease or end stage renal disease: Secondary | ICD-10-CM | POA: Diagnosis not present

## 2020-12-29 DIAGNOSIS — D631 Anemia in chronic kidney disease: Secondary | ICD-10-CM | POA: Diagnosis not present

## 2020-12-29 DIAGNOSIS — N186 End stage renal disease: Secondary | ICD-10-CM | POA: Diagnosis not present

## 2020-12-29 DIAGNOSIS — N2581 Secondary hyperparathyroidism of renal origin: Secondary | ICD-10-CM | POA: Diagnosis not present

## 2020-12-29 DIAGNOSIS — X58XXXA Exposure to other specified factors, initial encounter: Secondary | ICD-10-CM | POA: Diagnosis not present

## 2020-12-29 DIAGNOSIS — D509 Iron deficiency anemia, unspecified: Secondary | ICD-10-CM | POA: Diagnosis not present

## 2020-12-29 DIAGNOSIS — Z992 Dependence on renal dialysis: Secondary | ICD-10-CM | POA: Diagnosis not present

## 2020-12-30 DIAGNOSIS — Y828 Other medical devices associated with adverse incidents: Secondary | ICD-10-CM | POA: Diagnosis not present

## 2020-12-30 DIAGNOSIS — N2581 Secondary hyperparathyroidism of renal origin: Secondary | ICD-10-CM | POA: Diagnosis not present

## 2020-12-30 DIAGNOSIS — T85691A Other mechanical complication of intraperitoneal dialysis catheter, initial encounter: Secondary | ICD-10-CM | POA: Diagnosis not present

## 2020-12-30 DIAGNOSIS — R9431 Abnormal electrocardiogram [ECG] [EKG]: Secondary | ICD-10-CM | POA: Diagnosis not present

## 2020-12-30 DIAGNOSIS — I120 Hypertensive chronic kidney disease with stage 5 chronic kidney disease or end stage renal disease: Secondary | ICD-10-CM | POA: Diagnosis not present

## 2020-12-30 DIAGNOSIS — Z79899 Other long term (current) drug therapy: Secondary | ICD-10-CM | POA: Diagnosis not present

## 2020-12-30 DIAGNOSIS — N186 End stage renal disease: Secondary | ICD-10-CM | POA: Diagnosis not present

## 2020-12-30 DIAGNOSIS — D509 Iron deficiency anemia, unspecified: Secondary | ICD-10-CM | POA: Diagnosis not present

## 2020-12-30 DIAGNOSIS — Y929 Unspecified place or not applicable: Secondary | ICD-10-CM | POA: Diagnosis not present

## 2020-12-30 DIAGNOSIS — T8249XA Other complication of vascular dialysis catheter, initial encounter: Secondary | ICD-10-CM | POA: Diagnosis not present

## 2020-12-30 DIAGNOSIS — Z992 Dependence on renal dialysis: Secondary | ICD-10-CM | POA: Diagnosis not present

## 2020-12-30 DIAGNOSIS — D631 Anemia in chronic kidney disease: Secondary | ICD-10-CM | POA: Diagnosis not present

## 2020-12-31 DIAGNOSIS — D509 Iron deficiency anemia, unspecified: Secondary | ICD-10-CM | POA: Diagnosis not present

## 2020-12-31 DIAGNOSIS — N186 End stage renal disease: Secondary | ICD-10-CM | POA: Diagnosis not present

## 2020-12-31 DIAGNOSIS — N2581 Secondary hyperparathyroidism of renal origin: Secondary | ICD-10-CM | POA: Diagnosis not present

## 2020-12-31 DIAGNOSIS — D631 Anemia in chronic kidney disease: Secondary | ICD-10-CM | POA: Diagnosis not present

## 2021-01-01 DIAGNOSIS — D631 Anemia in chronic kidney disease: Secondary | ICD-10-CM | POA: Diagnosis not present

## 2021-01-01 DIAGNOSIS — N2581 Secondary hyperparathyroidism of renal origin: Secondary | ICD-10-CM | POA: Diagnosis not present

## 2021-01-01 DIAGNOSIS — N186 End stage renal disease: Secondary | ICD-10-CM | POA: Diagnosis not present

## 2021-01-01 DIAGNOSIS — D509 Iron deficiency anemia, unspecified: Secondary | ICD-10-CM | POA: Diagnosis not present

## 2021-01-02 DIAGNOSIS — N186 End stage renal disease: Secondary | ICD-10-CM | POA: Diagnosis not present

## 2021-01-03 DIAGNOSIS — N186 End stage renal disease: Secondary | ICD-10-CM | POA: Diagnosis not present

## 2021-01-04 DIAGNOSIS — N186 End stage renal disease: Secondary | ICD-10-CM | POA: Diagnosis not present

## 2021-01-05 DIAGNOSIS — N186 End stage renal disease: Secondary | ICD-10-CM | POA: Diagnosis not present

## 2021-01-06 DIAGNOSIS — N186 End stage renal disease: Secondary | ICD-10-CM | POA: Diagnosis not present

## 2021-01-07 DIAGNOSIS — N186 End stage renal disease: Secondary | ICD-10-CM | POA: Diagnosis not present

## 2021-01-08 DIAGNOSIS — N186 End stage renal disease: Secondary | ICD-10-CM | POA: Diagnosis not present

## 2021-01-09 DIAGNOSIS — N186 End stage renal disease: Secondary | ICD-10-CM | POA: Diagnosis not present

## 2021-01-10 DIAGNOSIS — N186 End stage renal disease: Secondary | ICD-10-CM | POA: Diagnosis not present

## 2021-01-11 DIAGNOSIS — N186 End stage renal disease: Secondary | ICD-10-CM | POA: Diagnosis not present

## 2021-01-12 DIAGNOSIS — N186 End stage renal disease: Secondary | ICD-10-CM | POA: Diagnosis not present

## 2021-01-13 DIAGNOSIS — N186 End stage renal disease: Secondary | ICD-10-CM | POA: Diagnosis not present

## 2021-01-14 DIAGNOSIS — N186 End stage renal disease: Secondary | ICD-10-CM | POA: Diagnosis not present

## 2021-01-15 DIAGNOSIS — N186 End stage renal disease: Secondary | ICD-10-CM | POA: Diagnosis not present

## 2021-01-16 DIAGNOSIS — N186 End stage renal disease: Secondary | ICD-10-CM | POA: Diagnosis not present

## 2021-01-17 DIAGNOSIS — N186 End stage renal disease: Secondary | ICD-10-CM | POA: Diagnosis not present

## 2021-01-18 DIAGNOSIS — N186 End stage renal disease: Secondary | ICD-10-CM | POA: Diagnosis not present

## 2021-01-19 DIAGNOSIS — N186 End stage renal disease: Secondary | ICD-10-CM | POA: Diagnosis not present

## 2021-01-20 DIAGNOSIS — N186 End stage renal disease: Secondary | ICD-10-CM | POA: Diagnosis not present

## 2021-01-21 DIAGNOSIS — N186 End stage renal disease: Secondary | ICD-10-CM | POA: Diagnosis not present

## 2021-01-22 DIAGNOSIS — N186 End stage renal disease: Secondary | ICD-10-CM | POA: Diagnosis not present

## 2021-01-22 DIAGNOSIS — N2581 Secondary hyperparathyroidism of renal origin: Secondary | ICD-10-CM | POA: Diagnosis not present

## 2021-01-22 DIAGNOSIS — D631 Anemia in chronic kidney disease: Secondary | ICD-10-CM | POA: Diagnosis not present

## 2021-01-22 DIAGNOSIS — I10 Essential (primary) hypertension: Secondary | ICD-10-CM | POA: Diagnosis not present

## 2021-01-22 DIAGNOSIS — D649 Anemia, unspecified: Secondary | ICD-10-CM | POA: Diagnosis not present

## 2021-01-23 DIAGNOSIS — D631 Anemia in chronic kidney disease: Secondary | ICD-10-CM | POA: Diagnosis not present

## 2021-01-23 DIAGNOSIS — N2581 Secondary hyperparathyroidism of renal origin: Secondary | ICD-10-CM | POA: Diagnosis not present

## 2021-01-23 DIAGNOSIS — N186 End stage renal disease: Secondary | ICD-10-CM | POA: Diagnosis not present

## 2021-01-24 DIAGNOSIS — Z79899 Other long term (current) drug therapy: Secondary | ICD-10-CM | POA: Diagnosis not present

## 2021-01-24 DIAGNOSIS — Z7289 Other problems related to lifestyle: Secondary | ICD-10-CM | POA: Diagnosis not present

## 2021-01-24 DIAGNOSIS — D631 Anemia in chronic kidney disease: Secondary | ICD-10-CM | POA: Diagnosis not present

## 2021-01-24 DIAGNOSIS — Z01818 Encounter for other preprocedural examination: Secondary | ICD-10-CM | POA: Diagnosis not present

## 2021-01-24 DIAGNOSIS — Z7982 Long term (current) use of aspirin: Secondary | ICD-10-CM | POA: Diagnosis not present

## 2021-01-24 DIAGNOSIS — E1122 Type 2 diabetes mellitus with diabetic chronic kidney disease: Secondary | ICD-10-CM | POA: Diagnosis not present

## 2021-01-24 DIAGNOSIS — N186 End stage renal disease: Secondary | ICD-10-CM | POA: Diagnosis not present

## 2021-01-24 DIAGNOSIS — Z6831 Body mass index (BMI) 31.0-31.9, adult: Secondary | ICD-10-CM | POA: Diagnosis not present

## 2021-01-24 DIAGNOSIS — I160 Hypertensive urgency: Secondary | ICD-10-CM | POA: Diagnosis not present

## 2021-01-24 DIAGNOSIS — N2581 Secondary hyperparathyroidism of renal origin: Secondary | ICD-10-CM | POA: Diagnosis not present

## 2021-01-24 DIAGNOSIS — Z114 Encounter for screening for human immunodeficiency virus [HIV]: Secondary | ICD-10-CM | POA: Diagnosis not present

## 2021-01-24 DIAGNOSIS — I120 Hypertensive chronic kidney disease with stage 5 chronic kidney disease or end stage renal disease: Secondary | ICD-10-CM | POA: Diagnosis not present

## 2021-01-24 DIAGNOSIS — Z602 Problems related to living alone: Secondary | ICD-10-CM | POA: Diagnosis not present

## 2021-01-25 DIAGNOSIS — N186 End stage renal disease: Secondary | ICD-10-CM | POA: Diagnosis not present

## 2021-01-25 DIAGNOSIS — D631 Anemia in chronic kidney disease: Secondary | ICD-10-CM | POA: Diagnosis not present

## 2021-01-25 DIAGNOSIS — N2581 Secondary hyperparathyroidism of renal origin: Secondary | ICD-10-CM | POA: Diagnosis not present

## 2021-01-26 DIAGNOSIS — D631 Anemia in chronic kidney disease: Secondary | ICD-10-CM | POA: Diagnosis not present

## 2021-01-26 DIAGNOSIS — N186 End stage renal disease: Secondary | ICD-10-CM | POA: Diagnosis not present

## 2021-01-26 DIAGNOSIS — N2581 Secondary hyperparathyroidism of renal origin: Secondary | ICD-10-CM | POA: Diagnosis not present

## 2021-01-27 DIAGNOSIS — D631 Anemia in chronic kidney disease: Secondary | ICD-10-CM | POA: Diagnosis not present

## 2021-01-27 DIAGNOSIS — N2581 Secondary hyperparathyroidism of renal origin: Secondary | ICD-10-CM | POA: Diagnosis not present

## 2021-01-27 DIAGNOSIS — N186 End stage renal disease: Secondary | ICD-10-CM | POA: Diagnosis not present

## 2021-01-28 DIAGNOSIS — D631 Anemia in chronic kidney disease: Secondary | ICD-10-CM | POA: Diagnosis not present

## 2021-01-28 DIAGNOSIS — N2581 Secondary hyperparathyroidism of renal origin: Secondary | ICD-10-CM | POA: Diagnosis not present

## 2021-01-28 DIAGNOSIS — N186 End stage renal disease: Secondary | ICD-10-CM | POA: Diagnosis not present

## 2021-01-29 DIAGNOSIS — D631 Anemia in chronic kidney disease: Secondary | ICD-10-CM | POA: Diagnosis not present

## 2021-01-29 DIAGNOSIS — N186 End stage renal disease: Secondary | ICD-10-CM | POA: Diagnosis not present

## 2021-01-29 DIAGNOSIS — N2581 Secondary hyperparathyroidism of renal origin: Secondary | ICD-10-CM | POA: Diagnosis not present

## 2021-01-30 DIAGNOSIS — N186 End stage renal disease: Secondary | ICD-10-CM | POA: Diagnosis not present

## 2021-01-30 DIAGNOSIS — N2581 Secondary hyperparathyroidism of renal origin: Secondary | ICD-10-CM | POA: Diagnosis not present

## 2021-01-30 DIAGNOSIS — D631 Anemia in chronic kidney disease: Secondary | ICD-10-CM | POA: Diagnosis not present

## 2021-01-31 DIAGNOSIS — N2581 Secondary hyperparathyroidism of renal origin: Secondary | ICD-10-CM | POA: Diagnosis not present

## 2021-01-31 DIAGNOSIS — N186 End stage renal disease: Secondary | ICD-10-CM | POA: Diagnosis not present

## 2021-01-31 DIAGNOSIS — D631 Anemia in chronic kidney disease: Secondary | ICD-10-CM | POA: Diagnosis not present

## 2021-02-01 DIAGNOSIS — N186 End stage renal disease: Secondary | ICD-10-CM | POA: Diagnosis not present

## 2021-02-02 DIAGNOSIS — N186 End stage renal disease: Secondary | ICD-10-CM | POA: Diagnosis not present

## 2021-02-02 DIAGNOSIS — Z23 Encounter for immunization: Secondary | ICD-10-CM | POA: Diagnosis not present

## 2021-02-02 DIAGNOSIS — F411 Generalized anxiety disorder: Secondary | ICD-10-CM | POA: Diagnosis not present

## 2021-02-03 DIAGNOSIS — N186 End stage renal disease: Secondary | ICD-10-CM | POA: Diagnosis not present

## 2021-02-04 DIAGNOSIS — N186 End stage renal disease: Secondary | ICD-10-CM | POA: Diagnosis not present

## 2021-02-05 DIAGNOSIS — N186 End stage renal disease: Secondary | ICD-10-CM | POA: Diagnosis not present

## 2021-02-06 DIAGNOSIS — N186 End stage renal disease: Secondary | ICD-10-CM | POA: Diagnosis not present

## 2021-02-07 DIAGNOSIS — N186 End stage renal disease: Secondary | ICD-10-CM | POA: Diagnosis not present

## 2021-02-08 DIAGNOSIS — N186 End stage renal disease: Secondary | ICD-10-CM | POA: Diagnosis not present

## 2021-02-09 DIAGNOSIS — N186 End stage renal disease: Secondary | ICD-10-CM | POA: Diagnosis not present

## 2021-02-10 DIAGNOSIS — N186 End stage renal disease: Secondary | ICD-10-CM | POA: Diagnosis not present

## 2021-02-11 DIAGNOSIS — N186 End stage renal disease: Secondary | ICD-10-CM | POA: Diagnosis not present

## 2021-02-12 DIAGNOSIS — N186 End stage renal disease: Secondary | ICD-10-CM | POA: Diagnosis not present

## 2021-02-13 DIAGNOSIS — N186 End stage renal disease: Secondary | ICD-10-CM | POA: Diagnosis not present

## 2021-02-14 DIAGNOSIS — N186 End stage renal disease: Secondary | ICD-10-CM | POA: Diagnosis not present

## 2021-02-15 DIAGNOSIS — N186 End stage renal disease: Secondary | ICD-10-CM | POA: Diagnosis not present

## 2021-02-16 DIAGNOSIS — N186 End stage renal disease: Secondary | ICD-10-CM | POA: Diagnosis not present

## 2021-02-17 DIAGNOSIS — N186 End stage renal disease: Secondary | ICD-10-CM | POA: Diagnosis not present

## 2021-02-18 DIAGNOSIS — N186 End stage renal disease: Secondary | ICD-10-CM | POA: Diagnosis not present

## 2021-02-19 DIAGNOSIS — N186 End stage renal disease: Secondary | ICD-10-CM | POA: Diagnosis not present

## 2021-02-20 DIAGNOSIS — N186 End stage renal disease: Secondary | ICD-10-CM | POA: Diagnosis not present

## 2021-02-21 DIAGNOSIS — N186 End stage renal disease: Secondary | ICD-10-CM | POA: Diagnosis not present

## 2021-02-22 DIAGNOSIS — I10 Essential (primary) hypertension: Secondary | ICD-10-CM | POA: Diagnosis not present

## 2021-02-22 DIAGNOSIS — N186 End stage renal disease: Secondary | ICD-10-CM | POA: Diagnosis not present

## 2021-02-22 DIAGNOSIS — R809 Proteinuria, unspecified: Secondary | ICD-10-CM | POA: Diagnosis not present

## 2021-02-23 DIAGNOSIS — N186 End stage renal disease: Secondary | ICD-10-CM | POA: Diagnosis not present

## 2021-02-24 DIAGNOSIS — N186 End stage renal disease: Secondary | ICD-10-CM | POA: Diagnosis not present

## 2021-02-25 DIAGNOSIS — N186 End stage renal disease: Secondary | ICD-10-CM | POA: Diagnosis not present

## 2021-02-26 DIAGNOSIS — N186 End stage renal disease: Secondary | ICD-10-CM | POA: Diagnosis not present

## 2021-02-27 DIAGNOSIS — N186 End stage renal disease: Secondary | ICD-10-CM | POA: Diagnosis not present

## 2021-02-28 DIAGNOSIS — N186 End stage renal disease: Secondary | ICD-10-CM | POA: Diagnosis not present

## 2021-03-01 DIAGNOSIS — N186 End stage renal disease: Secondary | ICD-10-CM | POA: Diagnosis not present

## 2021-03-02 DIAGNOSIS — N186 End stage renal disease: Secondary | ICD-10-CM | POA: Diagnosis not present

## 2021-03-03 DIAGNOSIS — N186 End stage renal disease: Secondary | ICD-10-CM | POA: Diagnosis not present

## 2021-03-04 DIAGNOSIS — N2581 Secondary hyperparathyroidism of renal origin: Secondary | ICD-10-CM | POA: Diagnosis not present

## 2021-03-04 DIAGNOSIS — N186 End stage renal disease: Secondary | ICD-10-CM | POA: Diagnosis not present

## 2021-03-04 DIAGNOSIS — D509 Iron deficiency anemia, unspecified: Secondary | ICD-10-CM | POA: Diagnosis not present

## 2021-03-04 DIAGNOSIS — D631 Anemia in chronic kidney disease: Secondary | ICD-10-CM | POA: Diagnosis not present

## 2021-03-05 DIAGNOSIS — N2581 Secondary hyperparathyroidism of renal origin: Secondary | ICD-10-CM | POA: Diagnosis not present

## 2021-03-05 DIAGNOSIS — N186 End stage renal disease: Secondary | ICD-10-CM | POA: Diagnosis not present

## 2021-03-05 DIAGNOSIS — D509 Iron deficiency anemia, unspecified: Secondary | ICD-10-CM | POA: Diagnosis not present

## 2021-03-05 DIAGNOSIS — D631 Anemia in chronic kidney disease: Secondary | ICD-10-CM | POA: Diagnosis not present

## 2021-03-06 DIAGNOSIS — N2581 Secondary hyperparathyroidism of renal origin: Secondary | ICD-10-CM | POA: Diagnosis not present

## 2021-03-06 DIAGNOSIS — N186 End stage renal disease: Secondary | ICD-10-CM | POA: Diagnosis not present

## 2021-03-06 DIAGNOSIS — D631 Anemia in chronic kidney disease: Secondary | ICD-10-CM | POA: Diagnosis not present

## 2021-03-06 DIAGNOSIS — D509 Iron deficiency anemia, unspecified: Secondary | ICD-10-CM | POA: Diagnosis not present

## 2021-03-07 DIAGNOSIS — N186 End stage renal disease: Secondary | ICD-10-CM | POA: Diagnosis not present

## 2021-03-07 DIAGNOSIS — D631 Anemia in chronic kidney disease: Secondary | ICD-10-CM | POA: Diagnosis not present

## 2021-03-07 DIAGNOSIS — N2581 Secondary hyperparathyroidism of renal origin: Secondary | ICD-10-CM | POA: Diagnosis not present

## 2021-03-07 DIAGNOSIS — D509 Iron deficiency anemia, unspecified: Secondary | ICD-10-CM | POA: Diagnosis not present

## 2021-03-08 DIAGNOSIS — D631 Anemia in chronic kidney disease: Secondary | ICD-10-CM | POA: Diagnosis not present

## 2021-03-08 DIAGNOSIS — N186 End stage renal disease: Secondary | ICD-10-CM | POA: Diagnosis not present

## 2021-03-08 DIAGNOSIS — D509 Iron deficiency anemia, unspecified: Secondary | ICD-10-CM | POA: Diagnosis not present

## 2021-03-08 DIAGNOSIS — N2581 Secondary hyperparathyroidism of renal origin: Secondary | ICD-10-CM | POA: Diagnosis not present

## 2021-03-09 DIAGNOSIS — N186 End stage renal disease: Secondary | ICD-10-CM | POA: Diagnosis not present

## 2021-03-09 DIAGNOSIS — D509 Iron deficiency anemia, unspecified: Secondary | ICD-10-CM | POA: Diagnosis not present

## 2021-03-09 DIAGNOSIS — N2581 Secondary hyperparathyroidism of renal origin: Secondary | ICD-10-CM | POA: Diagnosis not present

## 2021-03-09 DIAGNOSIS — D631 Anemia in chronic kidney disease: Secondary | ICD-10-CM | POA: Diagnosis not present

## 2021-03-10 DIAGNOSIS — D631 Anemia in chronic kidney disease: Secondary | ICD-10-CM | POA: Diagnosis not present

## 2021-03-10 DIAGNOSIS — D509 Iron deficiency anemia, unspecified: Secondary | ICD-10-CM | POA: Diagnosis not present

## 2021-03-10 DIAGNOSIS — N186 End stage renal disease: Secondary | ICD-10-CM | POA: Diagnosis not present

## 2021-03-10 DIAGNOSIS — N2581 Secondary hyperparathyroidism of renal origin: Secondary | ICD-10-CM | POA: Diagnosis not present

## 2021-03-11 DIAGNOSIS — D631 Anemia in chronic kidney disease: Secondary | ICD-10-CM | POA: Diagnosis not present

## 2021-03-11 DIAGNOSIS — N186 End stage renal disease: Secondary | ICD-10-CM | POA: Diagnosis not present

## 2021-03-11 DIAGNOSIS — D509 Iron deficiency anemia, unspecified: Secondary | ICD-10-CM | POA: Diagnosis not present

## 2021-03-11 DIAGNOSIS — N2581 Secondary hyperparathyroidism of renal origin: Secondary | ICD-10-CM | POA: Diagnosis not present

## 2021-03-12 DIAGNOSIS — N186 End stage renal disease: Secondary | ICD-10-CM | POA: Diagnosis not present

## 2021-03-12 DIAGNOSIS — D509 Iron deficiency anemia, unspecified: Secondary | ICD-10-CM | POA: Diagnosis not present

## 2021-03-12 DIAGNOSIS — D631 Anemia in chronic kidney disease: Secondary | ICD-10-CM | POA: Diagnosis not present

## 2021-03-12 DIAGNOSIS — N2581 Secondary hyperparathyroidism of renal origin: Secondary | ICD-10-CM | POA: Diagnosis not present

## 2021-03-13 DIAGNOSIS — N186 End stage renal disease: Secondary | ICD-10-CM | POA: Diagnosis not present

## 2021-03-13 DIAGNOSIS — N2581 Secondary hyperparathyroidism of renal origin: Secondary | ICD-10-CM | POA: Diagnosis not present

## 2021-03-13 DIAGNOSIS — D631 Anemia in chronic kidney disease: Secondary | ICD-10-CM | POA: Diagnosis not present

## 2021-03-13 DIAGNOSIS — D509 Iron deficiency anemia, unspecified: Secondary | ICD-10-CM | POA: Diagnosis not present

## 2021-03-14 DIAGNOSIS — N186 End stage renal disease: Secondary | ICD-10-CM | POA: Diagnosis not present

## 2021-03-15 DIAGNOSIS — N186 End stage renal disease: Secondary | ICD-10-CM | POA: Diagnosis not present

## 2021-03-16 DIAGNOSIS — N186 End stage renal disease: Secondary | ICD-10-CM | POA: Diagnosis not present

## 2021-03-17 DIAGNOSIS — N186 End stage renal disease: Secondary | ICD-10-CM | POA: Diagnosis not present

## 2021-03-18 DIAGNOSIS — N186 End stage renal disease: Secondary | ICD-10-CM | POA: Diagnosis not present

## 2021-03-19 DIAGNOSIS — N186 End stage renal disease: Secondary | ICD-10-CM | POA: Diagnosis not present

## 2021-03-20 DIAGNOSIS — N186 End stage renal disease: Secondary | ICD-10-CM | POA: Diagnosis not present

## 2021-03-21 DIAGNOSIS — N186 End stage renal disease: Secondary | ICD-10-CM | POA: Diagnosis not present

## 2021-03-22 DIAGNOSIS — N186 End stage renal disease: Secondary | ICD-10-CM | POA: Diagnosis not present

## 2021-03-23 DIAGNOSIS — N186 End stage renal disease: Secondary | ICD-10-CM | POA: Diagnosis not present

## 2021-03-24 DIAGNOSIS — N2581 Secondary hyperparathyroidism of renal origin: Secondary | ICD-10-CM | POA: Diagnosis not present

## 2021-03-24 DIAGNOSIS — D631 Anemia in chronic kidney disease: Secondary | ICD-10-CM | POA: Diagnosis not present

## 2021-03-24 DIAGNOSIS — D509 Iron deficiency anemia, unspecified: Secondary | ICD-10-CM | POA: Diagnosis not present

## 2021-03-24 DIAGNOSIS — I10 Essential (primary) hypertension: Secondary | ICD-10-CM | POA: Diagnosis not present

## 2021-03-24 DIAGNOSIS — N186 End stage renal disease: Secondary | ICD-10-CM | POA: Diagnosis not present

## 2021-03-25 DIAGNOSIS — N2581 Secondary hyperparathyroidism of renal origin: Secondary | ICD-10-CM | POA: Diagnosis not present

## 2021-03-25 DIAGNOSIS — N186 End stage renal disease: Secondary | ICD-10-CM | POA: Diagnosis not present

## 2021-03-25 DIAGNOSIS — D631 Anemia in chronic kidney disease: Secondary | ICD-10-CM | POA: Diagnosis not present

## 2021-03-25 DIAGNOSIS — D509 Iron deficiency anemia, unspecified: Secondary | ICD-10-CM | POA: Diagnosis not present

## 2021-03-26 DIAGNOSIS — N2581 Secondary hyperparathyroidism of renal origin: Secondary | ICD-10-CM | POA: Diagnosis not present

## 2021-03-26 DIAGNOSIS — N186 End stage renal disease: Secondary | ICD-10-CM | POA: Diagnosis not present

## 2021-03-26 DIAGNOSIS — D509 Iron deficiency anemia, unspecified: Secondary | ICD-10-CM | POA: Diagnosis not present

## 2021-03-26 DIAGNOSIS — D631 Anemia in chronic kidney disease: Secondary | ICD-10-CM | POA: Diagnosis not present

## 2021-03-27 DIAGNOSIS — N186 End stage renal disease: Secondary | ICD-10-CM | POA: Diagnosis not present

## 2021-03-27 DIAGNOSIS — N2581 Secondary hyperparathyroidism of renal origin: Secondary | ICD-10-CM | POA: Diagnosis not present

## 2021-03-27 DIAGNOSIS — D631 Anemia in chronic kidney disease: Secondary | ICD-10-CM | POA: Diagnosis not present

## 2021-03-27 DIAGNOSIS — D509 Iron deficiency anemia, unspecified: Secondary | ICD-10-CM | POA: Diagnosis not present

## 2021-03-28 DIAGNOSIS — N186 End stage renal disease: Secondary | ICD-10-CM | POA: Diagnosis not present

## 2021-03-28 DIAGNOSIS — N2581 Secondary hyperparathyroidism of renal origin: Secondary | ICD-10-CM | POA: Diagnosis not present

## 2021-03-28 DIAGNOSIS — D631 Anemia in chronic kidney disease: Secondary | ICD-10-CM | POA: Diagnosis not present

## 2021-03-28 DIAGNOSIS — D509 Iron deficiency anemia, unspecified: Secondary | ICD-10-CM | POA: Diagnosis not present

## 2021-03-29 DIAGNOSIS — D631 Anemia in chronic kidney disease: Secondary | ICD-10-CM | POA: Diagnosis not present

## 2021-03-29 DIAGNOSIS — D509 Iron deficiency anemia, unspecified: Secondary | ICD-10-CM | POA: Diagnosis not present

## 2021-03-29 DIAGNOSIS — N2581 Secondary hyperparathyroidism of renal origin: Secondary | ICD-10-CM | POA: Diagnosis not present

## 2021-03-29 DIAGNOSIS — N186 End stage renal disease: Secondary | ICD-10-CM | POA: Diagnosis not present

## 2021-03-30 DIAGNOSIS — D509 Iron deficiency anemia, unspecified: Secondary | ICD-10-CM | POA: Diagnosis not present

## 2021-03-30 DIAGNOSIS — N186 End stage renal disease: Secondary | ICD-10-CM | POA: Diagnosis not present

## 2021-03-30 DIAGNOSIS — D631 Anemia in chronic kidney disease: Secondary | ICD-10-CM | POA: Diagnosis not present

## 2021-03-30 DIAGNOSIS — N2581 Secondary hyperparathyroidism of renal origin: Secondary | ICD-10-CM | POA: Diagnosis not present

## 2021-03-31 DIAGNOSIS — D631 Anemia in chronic kidney disease: Secondary | ICD-10-CM | POA: Diagnosis not present

## 2021-03-31 DIAGNOSIS — N2581 Secondary hyperparathyroidism of renal origin: Secondary | ICD-10-CM | POA: Diagnosis not present

## 2021-03-31 DIAGNOSIS — D509 Iron deficiency anemia, unspecified: Secondary | ICD-10-CM | POA: Diagnosis not present

## 2021-03-31 DIAGNOSIS — N186 End stage renal disease: Secondary | ICD-10-CM | POA: Diagnosis not present

## 2021-04-01 DIAGNOSIS — Z01818 Encounter for other preprocedural examination: Secondary | ICD-10-CM | POA: Diagnosis not present

## 2021-04-01 DIAGNOSIS — N2581 Secondary hyperparathyroidism of renal origin: Secondary | ICD-10-CM | POA: Diagnosis not present

## 2021-04-01 DIAGNOSIS — N186 End stage renal disease: Secondary | ICD-10-CM | POA: Diagnosis not present

## 2021-04-01 DIAGNOSIS — D509 Iron deficiency anemia, unspecified: Secondary | ICD-10-CM | POA: Diagnosis not present

## 2021-04-01 DIAGNOSIS — Z7682 Awaiting organ transplant status: Secondary | ICD-10-CM | POA: Diagnosis not present

## 2021-04-01 DIAGNOSIS — D631 Anemia in chronic kidney disease: Secondary | ICD-10-CM | POA: Diagnosis not present

## 2021-04-02 DIAGNOSIS — N2581 Secondary hyperparathyroidism of renal origin: Secondary | ICD-10-CM | POA: Diagnosis not present

## 2021-04-02 DIAGNOSIS — D631 Anemia in chronic kidney disease: Secondary | ICD-10-CM | POA: Diagnosis not present

## 2021-04-02 DIAGNOSIS — D509 Iron deficiency anemia, unspecified: Secondary | ICD-10-CM | POA: Diagnosis not present

## 2021-04-02 DIAGNOSIS — N186 End stage renal disease: Secondary | ICD-10-CM | POA: Diagnosis not present

## 2021-04-03 DIAGNOSIS — N186 End stage renal disease: Secondary | ICD-10-CM | POA: Diagnosis not present

## 2021-04-04 DIAGNOSIS — N186 End stage renal disease: Secondary | ICD-10-CM | POA: Diagnosis not present

## 2021-04-05 DIAGNOSIS — N186 End stage renal disease: Secondary | ICD-10-CM | POA: Diagnosis not present

## 2021-04-06 DIAGNOSIS — N186 End stage renal disease: Secondary | ICD-10-CM | POA: Diagnosis not present

## 2021-04-07 DIAGNOSIS — N186 End stage renal disease: Secondary | ICD-10-CM | POA: Diagnosis not present

## 2021-04-08 DIAGNOSIS — N186 End stage renal disease: Secondary | ICD-10-CM | POA: Diagnosis not present

## 2021-04-09 DIAGNOSIS — N186 End stage renal disease: Secondary | ICD-10-CM | POA: Diagnosis not present

## 2021-04-10 DIAGNOSIS — N186 End stage renal disease: Secondary | ICD-10-CM | POA: Diagnosis not present

## 2021-04-11 DIAGNOSIS — N186 End stage renal disease: Secondary | ICD-10-CM | POA: Diagnosis not present

## 2021-04-12 DIAGNOSIS — Z005 Encounter for examination of potential donor of organ and tissue: Secondary | ICD-10-CM | POA: Diagnosis not present

## 2021-04-12 DIAGNOSIS — Z01818 Encounter for other preprocedural examination: Secondary | ICD-10-CM | POA: Diagnosis not present

## 2021-04-12 DIAGNOSIS — N186 End stage renal disease: Secondary | ICD-10-CM | POA: Diagnosis not present

## 2021-04-13 DIAGNOSIS — N186 End stage renal disease: Secondary | ICD-10-CM | POA: Diagnosis not present

## 2021-04-14 DIAGNOSIS — N186 End stage renal disease: Secondary | ICD-10-CM | POA: Diagnosis not present

## 2021-04-15 DIAGNOSIS — N186 End stage renal disease: Secondary | ICD-10-CM | POA: Diagnosis not present

## 2021-04-16 DIAGNOSIS — N186 End stage renal disease: Secondary | ICD-10-CM | POA: Diagnosis not present

## 2021-04-17 DIAGNOSIS — N186 End stage renal disease: Secondary | ICD-10-CM | POA: Diagnosis not present

## 2021-04-18 DIAGNOSIS — N186 End stage renal disease: Secondary | ICD-10-CM | POA: Diagnosis not present

## 2021-04-19 DIAGNOSIS — N186 End stage renal disease: Secondary | ICD-10-CM | POA: Diagnosis not present

## 2021-04-20 DIAGNOSIS — N186 End stage renal disease: Secondary | ICD-10-CM | POA: Diagnosis not present

## 2021-04-21 DIAGNOSIS — N186 End stage renal disease: Secondary | ICD-10-CM | POA: Diagnosis not present

## 2021-04-22 DIAGNOSIS — N186 End stage renal disease: Secondary | ICD-10-CM | POA: Diagnosis not present

## 2021-04-23 DIAGNOSIS — N186 End stage renal disease: Secondary | ICD-10-CM | POA: Diagnosis not present

## 2021-04-24 DIAGNOSIS — N186 End stage renal disease: Secondary | ICD-10-CM | POA: Diagnosis not present

## 2021-04-24 DIAGNOSIS — I10 Essential (primary) hypertension: Secondary | ICD-10-CM | POA: Diagnosis not present

## 2021-04-24 DIAGNOSIS — D631 Anemia in chronic kidney disease: Secondary | ICD-10-CM | POA: Diagnosis not present

## 2021-04-25 DIAGNOSIS — N186 End stage renal disease: Secondary | ICD-10-CM | POA: Diagnosis not present

## 2021-04-26 DIAGNOSIS — N186 End stage renal disease: Secondary | ICD-10-CM | POA: Diagnosis not present

## 2021-04-27 DIAGNOSIS — N186 End stage renal disease: Secondary | ICD-10-CM | POA: Diagnosis not present

## 2021-04-28 DIAGNOSIS — N186 End stage renal disease: Secondary | ICD-10-CM | POA: Diagnosis not present

## 2021-04-29 DIAGNOSIS — N186 End stage renal disease: Secondary | ICD-10-CM | POA: Diagnosis not present

## 2021-04-30 DIAGNOSIS — N186 End stage renal disease: Secondary | ICD-10-CM | POA: Diagnosis not present

## 2021-05-01 DIAGNOSIS — N186 End stage renal disease: Secondary | ICD-10-CM | POA: Diagnosis not present

## 2021-05-02 DIAGNOSIS — N186 End stage renal disease: Secondary | ICD-10-CM | POA: Diagnosis not present

## 2021-05-03 DIAGNOSIS — N186 End stage renal disease: Secondary | ICD-10-CM | POA: Diagnosis not present

## 2021-05-04 DIAGNOSIS — N186 End stage renal disease: Secondary | ICD-10-CM | POA: Diagnosis not present

## 2021-05-04 DIAGNOSIS — D631 Anemia in chronic kidney disease: Secondary | ICD-10-CM | POA: Diagnosis not present

## 2021-05-04 DIAGNOSIS — D638 Anemia in other chronic diseases classified elsewhere: Secondary | ICD-10-CM | POA: Diagnosis not present

## 2021-05-04 DIAGNOSIS — N2581 Secondary hyperparathyroidism of renal origin: Secondary | ICD-10-CM | POA: Diagnosis not present

## 2021-05-04 DIAGNOSIS — I10 Essential (primary) hypertension: Secondary | ICD-10-CM | POA: Diagnosis not present

## 2021-05-04 DIAGNOSIS — Z992 Dependence on renal dialysis: Secondary | ICD-10-CM | POA: Diagnosis not present

## 2021-05-04 DIAGNOSIS — D509 Iron deficiency anemia, unspecified: Secondary | ICD-10-CM | POA: Diagnosis not present

## 2021-05-05 DIAGNOSIS — D631 Anemia in chronic kidney disease: Secondary | ICD-10-CM | POA: Diagnosis not present

## 2021-05-05 DIAGNOSIS — D509 Iron deficiency anemia, unspecified: Secondary | ICD-10-CM | POA: Diagnosis not present

## 2021-05-05 DIAGNOSIS — N186 End stage renal disease: Secondary | ICD-10-CM | POA: Diagnosis not present

## 2021-05-05 DIAGNOSIS — N2581 Secondary hyperparathyroidism of renal origin: Secondary | ICD-10-CM | POA: Diagnosis not present

## 2021-05-06 DIAGNOSIS — N2581 Secondary hyperparathyroidism of renal origin: Secondary | ICD-10-CM | POA: Diagnosis not present

## 2021-05-06 DIAGNOSIS — D631 Anemia in chronic kidney disease: Secondary | ICD-10-CM | POA: Diagnosis not present

## 2021-05-06 DIAGNOSIS — Z7682 Awaiting organ transplant status: Secondary | ICD-10-CM | POA: Diagnosis not present

## 2021-05-06 DIAGNOSIS — Z01818 Encounter for other preprocedural examination: Secondary | ICD-10-CM | POA: Diagnosis not present

## 2021-05-06 DIAGNOSIS — D509 Iron deficiency anemia, unspecified: Secondary | ICD-10-CM | POA: Diagnosis not present

## 2021-05-06 DIAGNOSIS — N186 End stage renal disease: Secondary | ICD-10-CM | POA: Diagnosis not present

## 2021-05-07 DIAGNOSIS — D631 Anemia in chronic kidney disease: Secondary | ICD-10-CM | POA: Diagnosis not present

## 2021-05-07 DIAGNOSIS — N186 End stage renal disease: Secondary | ICD-10-CM | POA: Diagnosis not present

## 2021-05-07 DIAGNOSIS — N2581 Secondary hyperparathyroidism of renal origin: Secondary | ICD-10-CM | POA: Diagnosis not present

## 2021-05-07 DIAGNOSIS — D509 Iron deficiency anemia, unspecified: Secondary | ICD-10-CM | POA: Diagnosis not present

## 2021-05-08 DIAGNOSIS — D631 Anemia in chronic kidney disease: Secondary | ICD-10-CM | POA: Diagnosis not present

## 2021-05-08 DIAGNOSIS — D509 Iron deficiency anemia, unspecified: Secondary | ICD-10-CM | POA: Diagnosis not present

## 2021-05-08 DIAGNOSIS — N186 End stage renal disease: Secondary | ICD-10-CM | POA: Diagnosis not present

## 2021-05-08 DIAGNOSIS — N2581 Secondary hyperparathyroidism of renal origin: Secondary | ICD-10-CM | POA: Diagnosis not present

## 2021-05-09 DIAGNOSIS — N2581 Secondary hyperparathyroidism of renal origin: Secondary | ICD-10-CM | POA: Diagnosis not present

## 2021-05-09 DIAGNOSIS — D509 Iron deficiency anemia, unspecified: Secondary | ICD-10-CM | POA: Diagnosis not present

## 2021-05-09 DIAGNOSIS — N186 End stage renal disease: Secondary | ICD-10-CM | POA: Diagnosis not present

## 2021-05-09 DIAGNOSIS — D631 Anemia in chronic kidney disease: Secondary | ICD-10-CM | POA: Diagnosis not present

## 2021-05-10 DIAGNOSIS — D509 Iron deficiency anemia, unspecified: Secondary | ICD-10-CM | POA: Diagnosis not present

## 2021-05-10 DIAGNOSIS — N2581 Secondary hyperparathyroidism of renal origin: Secondary | ICD-10-CM | POA: Diagnosis not present

## 2021-05-10 DIAGNOSIS — N186 End stage renal disease: Secondary | ICD-10-CM | POA: Diagnosis not present

## 2021-05-10 DIAGNOSIS — D631 Anemia in chronic kidney disease: Secondary | ICD-10-CM | POA: Diagnosis not present

## 2021-05-11 DIAGNOSIS — D631 Anemia in chronic kidney disease: Secondary | ICD-10-CM | POA: Diagnosis not present

## 2021-05-11 DIAGNOSIS — D509 Iron deficiency anemia, unspecified: Secondary | ICD-10-CM | POA: Diagnosis not present

## 2021-05-11 DIAGNOSIS — N2581 Secondary hyperparathyroidism of renal origin: Secondary | ICD-10-CM | POA: Diagnosis not present

## 2021-05-11 DIAGNOSIS — N186 End stage renal disease: Secondary | ICD-10-CM | POA: Diagnosis not present

## 2021-05-12 DIAGNOSIS — D631 Anemia in chronic kidney disease: Secondary | ICD-10-CM | POA: Diagnosis not present

## 2021-05-12 DIAGNOSIS — N186 End stage renal disease: Secondary | ICD-10-CM | POA: Diagnosis not present

## 2021-05-12 DIAGNOSIS — D509 Iron deficiency anemia, unspecified: Secondary | ICD-10-CM | POA: Diagnosis not present

## 2021-05-12 DIAGNOSIS — N2581 Secondary hyperparathyroidism of renal origin: Secondary | ICD-10-CM | POA: Diagnosis not present

## 2021-05-13 DIAGNOSIS — D509 Iron deficiency anemia, unspecified: Secondary | ICD-10-CM | POA: Diagnosis not present

## 2021-05-13 DIAGNOSIS — N2581 Secondary hyperparathyroidism of renal origin: Secondary | ICD-10-CM | POA: Diagnosis not present

## 2021-05-13 DIAGNOSIS — N186 End stage renal disease: Secondary | ICD-10-CM | POA: Diagnosis not present

## 2021-05-13 DIAGNOSIS — D631 Anemia in chronic kidney disease: Secondary | ICD-10-CM | POA: Diagnosis not present

## 2021-05-14 DIAGNOSIS — Z23 Encounter for immunization: Secondary | ICD-10-CM | POA: Diagnosis not present

## 2021-05-14 DIAGNOSIS — N186 End stage renal disease: Secondary | ICD-10-CM | POA: Diagnosis not present

## 2021-05-15 DIAGNOSIS — N186 End stage renal disease: Secondary | ICD-10-CM | POA: Diagnosis not present

## 2021-05-15 DIAGNOSIS — Z23 Encounter for immunization: Secondary | ICD-10-CM | POA: Diagnosis not present

## 2021-05-16 DIAGNOSIS — N186 End stage renal disease: Secondary | ICD-10-CM | POA: Diagnosis not present

## 2021-05-16 DIAGNOSIS — Z23 Encounter for immunization: Secondary | ICD-10-CM | POA: Diagnosis not present

## 2021-05-17 DIAGNOSIS — Z23 Encounter for immunization: Secondary | ICD-10-CM | POA: Diagnosis not present

## 2021-05-17 DIAGNOSIS — N186 End stage renal disease: Secondary | ICD-10-CM | POA: Diagnosis not present

## 2021-05-18 DIAGNOSIS — Z23 Encounter for immunization: Secondary | ICD-10-CM | POA: Diagnosis not present

## 2021-05-18 DIAGNOSIS — N186 End stage renal disease: Secondary | ICD-10-CM | POA: Diagnosis not present

## 2021-05-19 DIAGNOSIS — Z23 Encounter for immunization: Secondary | ICD-10-CM | POA: Diagnosis not present

## 2021-05-19 DIAGNOSIS — N186 End stage renal disease: Secondary | ICD-10-CM | POA: Diagnosis not present

## 2021-05-20 DIAGNOSIS — Z23 Encounter for immunization: Secondary | ICD-10-CM | POA: Diagnosis not present

## 2021-05-20 DIAGNOSIS — N186 End stage renal disease: Secondary | ICD-10-CM | POA: Diagnosis not present

## 2021-05-21 DIAGNOSIS — Z23 Encounter for immunization: Secondary | ICD-10-CM | POA: Diagnosis not present

## 2021-05-21 DIAGNOSIS — N186 End stage renal disease: Secondary | ICD-10-CM | POA: Diagnosis not present

## 2021-05-22 DIAGNOSIS — Z23 Encounter for immunization: Secondary | ICD-10-CM | POA: Diagnosis not present

## 2021-05-22 DIAGNOSIS — N186 End stage renal disease: Secondary | ICD-10-CM | POA: Diagnosis not present

## 2021-05-23 DIAGNOSIS — N186 End stage renal disease: Secondary | ICD-10-CM | POA: Diagnosis not present

## 2021-05-23 DIAGNOSIS — Z23 Encounter for immunization: Secondary | ICD-10-CM | POA: Diagnosis not present

## 2021-05-24 DIAGNOSIS — Z23 Encounter for immunization: Secondary | ICD-10-CM | POA: Diagnosis not present

## 2021-05-24 DIAGNOSIS — N186 End stage renal disease: Secondary | ICD-10-CM | POA: Diagnosis not present

## 2021-05-25 DIAGNOSIS — N2581 Secondary hyperparathyroidism of renal origin: Secondary | ICD-10-CM | POA: Diagnosis not present

## 2021-05-25 DIAGNOSIS — D631 Anemia in chronic kidney disease: Secondary | ICD-10-CM | POA: Diagnosis not present

## 2021-05-25 DIAGNOSIS — N186 End stage renal disease: Secondary | ICD-10-CM | POA: Diagnosis not present

## 2021-05-25 DIAGNOSIS — I10 Essential (primary) hypertension: Secondary | ICD-10-CM | POA: Diagnosis not present

## 2021-05-25 DIAGNOSIS — D509 Iron deficiency anemia, unspecified: Secondary | ICD-10-CM | POA: Diagnosis not present

## 2021-05-26 DIAGNOSIS — D509 Iron deficiency anemia, unspecified: Secondary | ICD-10-CM | POA: Diagnosis not present

## 2021-05-26 DIAGNOSIS — N2581 Secondary hyperparathyroidism of renal origin: Secondary | ICD-10-CM | POA: Diagnosis not present

## 2021-05-26 DIAGNOSIS — D631 Anemia in chronic kidney disease: Secondary | ICD-10-CM | POA: Diagnosis not present

## 2021-05-26 DIAGNOSIS — N186 End stage renal disease: Secondary | ICD-10-CM | POA: Diagnosis not present

## 2021-05-27 DIAGNOSIS — D509 Iron deficiency anemia, unspecified: Secondary | ICD-10-CM | POA: Diagnosis not present

## 2021-05-27 DIAGNOSIS — D631 Anemia in chronic kidney disease: Secondary | ICD-10-CM | POA: Diagnosis not present

## 2021-05-27 DIAGNOSIS — N186 End stage renal disease: Secondary | ICD-10-CM | POA: Diagnosis not present

## 2021-05-27 DIAGNOSIS — N2581 Secondary hyperparathyroidism of renal origin: Secondary | ICD-10-CM | POA: Diagnosis not present

## 2021-05-28 DIAGNOSIS — N2581 Secondary hyperparathyroidism of renal origin: Secondary | ICD-10-CM | POA: Diagnosis not present

## 2021-05-28 DIAGNOSIS — N186 End stage renal disease: Secondary | ICD-10-CM | POA: Diagnosis not present

## 2021-05-28 DIAGNOSIS — D631 Anemia in chronic kidney disease: Secondary | ICD-10-CM | POA: Diagnosis not present

## 2021-05-28 DIAGNOSIS — D509 Iron deficiency anemia, unspecified: Secondary | ICD-10-CM | POA: Diagnosis not present

## 2021-05-29 DIAGNOSIS — N186 End stage renal disease: Secondary | ICD-10-CM | POA: Diagnosis not present

## 2021-05-29 DIAGNOSIS — N2581 Secondary hyperparathyroidism of renal origin: Secondary | ICD-10-CM | POA: Diagnosis not present

## 2021-05-29 DIAGNOSIS — D509 Iron deficiency anemia, unspecified: Secondary | ICD-10-CM | POA: Diagnosis not present

## 2021-05-29 DIAGNOSIS — D631 Anemia in chronic kidney disease: Secondary | ICD-10-CM | POA: Diagnosis not present

## 2021-05-30 DIAGNOSIS — N2581 Secondary hyperparathyroidism of renal origin: Secondary | ICD-10-CM | POA: Diagnosis not present

## 2021-05-30 DIAGNOSIS — N186 End stage renal disease: Secondary | ICD-10-CM | POA: Diagnosis not present

## 2021-05-30 DIAGNOSIS — D631 Anemia in chronic kidney disease: Secondary | ICD-10-CM | POA: Diagnosis not present

## 2021-05-30 DIAGNOSIS — D509 Iron deficiency anemia, unspecified: Secondary | ICD-10-CM | POA: Diagnosis not present

## 2021-05-31 DIAGNOSIS — D509 Iron deficiency anemia, unspecified: Secondary | ICD-10-CM | POA: Diagnosis not present

## 2021-05-31 DIAGNOSIS — N2581 Secondary hyperparathyroidism of renal origin: Secondary | ICD-10-CM | POA: Diagnosis not present

## 2021-05-31 DIAGNOSIS — D631 Anemia in chronic kidney disease: Secondary | ICD-10-CM | POA: Diagnosis not present

## 2021-05-31 DIAGNOSIS — N186 End stage renal disease: Secondary | ICD-10-CM | POA: Diagnosis not present

## 2021-06-01 DIAGNOSIS — D631 Anemia in chronic kidney disease: Secondary | ICD-10-CM | POA: Diagnosis not present

## 2021-06-01 DIAGNOSIS — D509 Iron deficiency anemia, unspecified: Secondary | ICD-10-CM | POA: Diagnosis not present

## 2021-06-01 DIAGNOSIS — N186 End stage renal disease: Secondary | ICD-10-CM | POA: Diagnosis not present

## 2021-06-01 DIAGNOSIS — N2581 Secondary hyperparathyroidism of renal origin: Secondary | ICD-10-CM | POA: Diagnosis not present

## 2021-06-02 DIAGNOSIS — D509 Iron deficiency anemia, unspecified: Secondary | ICD-10-CM | POA: Diagnosis not present

## 2021-06-02 DIAGNOSIS — D631 Anemia in chronic kidney disease: Secondary | ICD-10-CM | POA: Diagnosis not present

## 2021-06-02 DIAGNOSIS — N2581 Secondary hyperparathyroidism of renal origin: Secondary | ICD-10-CM | POA: Diagnosis not present

## 2021-06-02 DIAGNOSIS — N186 End stage renal disease: Secondary | ICD-10-CM | POA: Diagnosis not present

## 2021-06-03 DIAGNOSIS — N186 End stage renal disease: Secondary | ICD-10-CM | POA: Diagnosis not present

## 2021-06-03 DIAGNOSIS — Z7682 Awaiting organ transplant status: Secondary | ICD-10-CM | POA: Diagnosis not present

## 2021-06-03 DIAGNOSIS — D631 Anemia in chronic kidney disease: Secondary | ICD-10-CM | POA: Diagnosis not present

## 2021-06-03 DIAGNOSIS — Z1159 Encounter for screening for other viral diseases: Secondary | ICD-10-CM | POA: Diagnosis not present

## 2021-06-03 DIAGNOSIS — D509 Iron deficiency anemia, unspecified: Secondary | ICD-10-CM | POA: Diagnosis not present

## 2021-06-03 DIAGNOSIS — N2581 Secondary hyperparathyroidism of renal origin: Secondary | ICD-10-CM | POA: Diagnosis not present

## 2021-06-03 DIAGNOSIS — Z01818 Encounter for other preprocedural examination: Secondary | ICD-10-CM | POA: Diagnosis not present

## 2021-06-04 DIAGNOSIS — N186 End stage renal disease: Secondary | ICD-10-CM | POA: Diagnosis not present

## 2021-06-05 DIAGNOSIS — N186 End stage renal disease: Secondary | ICD-10-CM | POA: Diagnosis not present

## 2021-06-06 DIAGNOSIS — L299 Pruritus, unspecified: Secondary | ICD-10-CM | POA: Diagnosis not present

## 2021-06-06 DIAGNOSIS — Z992 Dependence on renal dialysis: Secondary | ICD-10-CM | POA: Diagnosis not present

## 2021-06-06 DIAGNOSIS — I10 Essential (primary) hypertension: Secondary | ICD-10-CM | POA: Diagnosis not present

## 2021-06-06 DIAGNOSIS — N186 End stage renal disease: Secondary | ICD-10-CM | POA: Diagnosis not present

## 2021-06-07 DIAGNOSIS — N186 End stage renal disease: Secondary | ICD-10-CM | POA: Diagnosis not present

## 2021-06-08 DIAGNOSIS — N186 End stage renal disease: Secondary | ICD-10-CM | POA: Diagnosis not present

## 2021-06-09 DIAGNOSIS — N186 End stage renal disease: Secondary | ICD-10-CM | POA: Diagnosis not present

## 2021-06-10 DIAGNOSIS — N186 End stage renal disease: Secondary | ICD-10-CM | POA: Diagnosis not present

## 2021-06-11 DIAGNOSIS — N186 End stage renal disease: Secondary | ICD-10-CM | POA: Diagnosis not present

## 2021-06-11 DIAGNOSIS — Z20822 Contact with and (suspected) exposure to covid-19: Secondary | ICD-10-CM | POA: Diagnosis not present

## 2021-06-12 DIAGNOSIS — N186 End stage renal disease: Secondary | ICD-10-CM | POA: Diagnosis not present

## 2021-06-13 DIAGNOSIS — N186 End stage renal disease: Secondary | ICD-10-CM | POA: Diagnosis not present

## 2021-06-14 DIAGNOSIS — N186 End stage renal disease: Secondary | ICD-10-CM | POA: Diagnosis not present

## 2021-06-14 DIAGNOSIS — N2581 Secondary hyperparathyroidism of renal origin: Secondary | ICD-10-CM | POA: Diagnosis not present

## 2021-06-15 DIAGNOSIS — N2581 Secondary hyperparathyroidism of renal origin: Secondary | ICD-10-CM | POA: Diagnosis not present

## 2021-06-15 DIAGNOSIS — N186 End stage renal disease: Secondary | ICD-10-CM | POA: Diagnosis not present

## 2021-06-16 DIAGNOSIS — N2581 Secondary hyperparathyroidism of renal origin: Secondary | ICD-10-CM | POA: Diagnosis not present

## 2021-06-16 DIAGNOSIS — N186 End stage renal disease: Secondary | ICD-10-CM | POA: Diagnosis not present

## 2021-06-17 DIAGNOSIS — N186 End stage renal disease: Secondary | ICD-10-CM | POA: Diagnosis not present

## 2021-06-17 DIAGNOSIS — N2581 Secondary hyperparathyroidism of renal origin: Secondary | ICD-10-CM | POA: Diagnosis not present

## 2021-06-18 DIAGNOSIS — N2581 Secondary hyperparathyroidism of renal origin: Secondary | ICD-10-CM | POA: Diagnosis not present

## 2021-06-18 DIAGNOSIS — N186 End stage renal disease: Secondary | ICD-10-CM | POA: Diagnosis not present

## 2021-06-19 DIAGNOSIS — N186 End stage renal disease: Secondary | ICD-10-CM | POA: Diagnosis not present

## 2021-06-19 DIAGNOSIS — N2581 Secondary hyperparathyroidism of renal origin: Secondary | ICD-10-CM | POA: Diagnosis not present

## 2021-06-20 DIAGNOSIS — N2581 Secondary hyperparathyroidism of renal origin: Secondary | ICD-10-CM | POA: Diagnosis not present

## 2021-06-20 DIAGNOSIS — N186 End stage renal disease: Secondary | ICD-10-CM | POA: Diagnosis not present

## 2021-06-21 DIAGNOSIS — N2581 Secondary hyperparathyroidism of renal origin: Secondary | ICD-10-CM | POA: Diagnosis not present

## 2021-06-21 DIAGNOSIS — N186 End stage renal disease: Secondary | ICD-10-CM | POA: Diagnosis not present

## 2021-06-22 DIAGNOSIS — I10 Essential (primary) hypertension: Secondary | ICD-10-CM | POA: Diagnosis not present

## 2021-06-22 DIAGNOSIS — D631 Anemia in chronic kidney disease: Secondary | ICD-10-CM | POA: Diagnosis not present

## 2021-06-22 DIAGNOSIS — N2581 Secondary hyperparathyroidism of renal origin: Secondary | ICD-10-CM | POA: Diagnosis not present

## 2021-06-22 DIAGNOSIS — D509 Iron deficiency anemia, unspecified: Secondary | ICD-10-CM | POA: Diagnosis not present

## 2021-06-22 DIAGNOSIS — N186 End stage renal disease: Secondary | ICD-10-CM | POA: Diagnosis not present

## 2021-06-23 DIAGNOSIS — N2581 Secondary hyperparathyroidism of renal origin: Secondary | ICD-10-CM | POA: Diagnosis not present

## 2021-06-23 DIAGNOSIS — D631 Anemia in chronic kidney disease: Secondary | ICD-10-CM | POA: Diagnosis not present

## 2021-06-23 DIAGNOSIS — N186 End stage renal disease: Secondary | ICD-10-CM | POA: Diagnosis not present

## 2021-06-23 DIAGNOSIS — D509 Iron deficiency anemia, unspecified: Secondary | ICD-10-CM | POA: Diagnosis not present

## 2021-06-24 DIAGNOSIS — D631 Anemia in chronic kidney disease: Secondary | ICD-10-CM | POA: Diagnosis not present

## 2021-06-24 DIAGNOSIS — N2581 Secondary hyperparathyroidism of renal origin: Secondary | ICD-10-CM | POA: Diagnosis not present

## 2021-06-24 DIAGNOSIS — N186 End stage renal disease: Secondary | ICD-10-CM | POA: Diagnosis not present

## 2021-06-24 DIAGNOSIS — D509 Iron deficiency anemia, unspecified: Secondary | ICD-10-CM | POA: Diagnosis not present

## 2021-06-25 DIAGNOSIS — D631 Anemia in chronic kidney disease: Secondary | ICD-10-CM | POA: Diagnosis not present

## 2021-06-25 DIAGNOSIS — D509 Iron deficiency anemia, unspecified: Secondary | ICD-10-CM | POA: Diagnosis not present

## 2021-06-25 DIAGNOSIS — N186 End stage renal disease: Secondary | ICD-10-CM | POA: Diagnosis not present

## 2021-06-25 DIAGNOSIS — N2581 Secondary hyperparathyroidism of renal origin: Secondary | ICD-10-CM | POA: Diagnosis not present

## 2021-06-26 DIAGNOSIS — D631 Anemia in chronic kidney disease: Secondary | ICD-10-CM | POA: Diagnosis not present

## 2021-06-26 DIAGNOSIS — N2581 Secondary hyperparathyroidism of renal origin: Secondary | ICD-10-CM | POA: Diagnosis not present

## 2021-06-26 DIAGNOSIS — D509 Iron deficiency anemia, unspecified: Secondary | ICD-10-CM | POA: Diagnosis not present

## 2021-06-26 DIAGNOSIS — N186 End stage renal disease: Secondary | ICD-10-CM | POA: Diagnosis not present

## 2021-06-27 DIAGNOSIS — D631 Anemia in chronic kidney disease: Secondary | ICD-10-CM | POA: Diagnosis not present

## 2021-06-27 DIAGNOSIS — N2581 Secondary hyperparathyroidism of renal origin: Secondary | ICD-10-CM | POA: Diagnosis not present

## 2021-06-27 DIAGNOSIS — D509 Iron deficiency anemia, unspecified: Secondary | ICD-10-CM | POA: Diagnosis not present

## 2021-06-27 DIAGNOSIS — N186 End stage renal disease: Secondary | ICD-10-CM | POA: Diagnosis not present

## 2021-06-28 DIAGNOSIS — N186 End stage renal disease: Secondary | ICD-10-CM | POA: Diagnosis not present

## 2021-06-28 DIAGNOSIS — D631 Anemia in chronic kidney disease: Secondary | ICD-10-CM | POA: Diagnosis not present

## 2021-06-28 DIAGNOSIS — D509 Iron deficiency anemia, unspecified: Secondary | ICD-10-CM | POA: Diagnosis not present

## 2021-06-28 DIAGNOSIS — N2581 Secondary hyperparathyroidism of renal origin: Secondary | ICD-10-CM | POA: Diagnosis not present

## 2021-06-29 DIAGNOSIS — D631 Anemia in chronic kidney disease: Secondary | ICD-10-CM | POA: Diagnosis not present

## 2021-06-29 DIAGNOSIS — D509 Iron deficiency anemia, unspecified: Secondary | ICD-10-CM | POA: Diagnosis not present

## 2021-06-29 DIAGNOSIS — N2581 Secondary hyperparathyroidism of renal origin: Secondary | ICD-10-CM | POA: Diagnosis not present

## 2021-06-29 DIAGNOSIS — N186 End stage renal disease: Secondary | ICD-10-CM | POA: Diagnosis not present

## 2021-06-30 DIAGNOSIS — Z992 Dependence on renal dialysis: Secondary | ICD-10-CM | POA: Diagnosis not present

## 2021-06-30 DIAGNOSIS — D631 Anemia in chronic kidney disease: Secondary | ICD-10-CM | POA: Diagnosis not present

## 2021-06-30 DIAGNOSIS — I129 Hypertensive chronic kidney disease with stage 1 through stage 4 chronic kidney disease, or unspecified chronic kidney disease: Secondary | ICD-10-CM | POA: Diagnosis not present

## 2021-06-30 DIAGNOSIS — Z79899 Other long term (current) drug therapy: Secondary | ICD-10-CM | POA: Diagnosis not present

## 2021-06-30 DIAGNOSIS — D509 Iron deficiency anemia, unspecified: Secondary | ICD-10-CM | POA: Diagnosis not present

## 2021-06-30 DIAGNOSIS — N184 Chronic kidney disease, stage 4 (severe): Secondary | ICD-10-CM | POA: Diagnosis not present

## 2021-06-30 DIAGNOSIS — N2889 Other specified disorders of kidney and ureter: Secondary | ICD-10-CM | POA: Diagnosis not present

## 2021-06-30 DIAGNOSIS — R319 Hematuria, unspecified: Secondary | ICD-10-CM | POA: Diagnosis not present

## 2021-06-30 DIAGNOSIS — I120 Hypertensive chronic kidney disease with stage 5 chronic kidney disease or end stage renal disease: Secondary | ICD-10-CM | POA: Diagnosis not present

## 2021-06-30 DIAGNOSIS — Z01818 Encounter for other preprocedural examination: Secondary | ICD-10-CM | POA: Diagnosis not present

## 2021-06-30 DIAGNOSIS — N2581 Secondary hyperparathyroidism of renal origin: Secondary | ICD-10-CM | POA: Diagnosis not present

## 2021-06-30 DIAGNOSIS — Z01810 Encounter for preprocedural cardiovascular examination: Secondary | ICD-10-CM | POA: Diagnosis not present

## 2021-06-30 DIAGNOSIS — Z7982 Long term (current) use of aspirin: Secondary | ICD-10-CM | POA: Diagnosis not present

## 2021-06-30 DIAGNOSIS — N186 End stage renal disease: Secondary | ICD-10-CM | POA: Diagnosis not present

## 2021-06-30 DIAGNOSIS — Z6831 Body mass index (BMI) 31.0-31.9, adult: Secondary | ICD-10-CM | POA: Diagnosis not present

## 2021-07-01 DIAGNOSIS — N186 End stage renal disease: Secondary | ICD-10-CM | POA: Diagnosis not present

## 2021-07-01 DIAGNOSIS — D509 Iron deficiency anemia, unspecified: Secondary | ICD-10-CM | POA: Diagnosis not present

## 2021-07-01 DIAGNOSIS — Z992 Dependence on renal dialysis: Secondary | ICD-10-CM | POA: Diagnosis not present

## 2021-07-01 DIAGNOSIS — Z01810 Encounter for preprocedural cardiovascular examination: Secondary | ICD-10-CM | POA: Diagnosis not present

## 2021-07-01 DIAGNOSIS — R319 Hematuria, unspecified: Secondary | ICD-10-CM | POA: Diagnosis not present

## 2021-07-01 DIAGNOSIS — N2889 Other specified disorders of kidney and ureter: Secondary | ICD-10-CM | POA: Diagnosis not present

## 2021-07-01 DIAGNOSIS — Z7982 Long term (current) use of aspirin: Secondary | ICD-10-CM | POA: Diagnosis not present

## 2021-07-01 DIAGNOSIS — N2581 Secondary hyperparathyroidism of renal origin: Secondary | ICD-10-CM | POA: Diagnosis not present

## 2021-07-01 DIAGNOSIS — I120 Hypertensive chronic kidney disease with stage 5 chronic kidney disease or end stage renal disease: Secondary | ICD-10-CM | POA: Diagnosis not present

## 2021-07-01 DIAGNOSIS — D631 Anemia in chronic kidney disease: Secondary | ICD-10-CM | POA: Diagnosis not present

## 2021-07-01 DIAGNOSIS — Z01818 Encounter for other preprocedural examination: Secondary | ICD-10-CM | POA: Diagnosis not present

## 2021-07-01 DIAGNOSIS — Z79899 Other long term (current) drug therapy: Secondary | ICD-10-CM | POA: Diagnosis not present

## 2021-07-02 DIAGNOSIS — N186 End stage renal disease: Secondary | ICD-10-CM | POA: Diagnosis not present

## 2021-07-03 DIAGNOSIS — N186 End stage renal disease: Secondary | ICD-10-CM | POA: Diagnosis not present

## 2021-07-04 DIAGNOSIS — N186 End stage renal disease: Secondary | ICD-10-CM | POA: Diagnosis not present

## 2021-07-05 DIAGNOSIS — N186 End stage renal disease: Secondary | ICD-10-CM | POA: Diagnosis not present

## 2021-07-06 DIAGNOSIS — N186 End stage renal disease: Secondary | ICD-10-CM | POA: Diagnosis not present

## 2021-07-07 DIAGNOSIS — N186 End stage renal disease: Secondary | ICD-10-CM | POA: Diagnosis not present

## 2021-07-08 DIAGNOSIS — N186 End stage renal disease: Secondary | ICD-10-CM | POA: Diagnosis not present

## 2021-07-09 DIAGNOSIS — N186 End stage renal disease: Secondary | ICD-10-CM | POA: Diagnosis not present

## 2021-07-10 DIAGNOSIS — N186 End stage renal disease: Secondary | ICD-10-CM | POA: Diagnosis not present

## 2021-07-11 DIAGNOSIS — N186 End stage renal disease: Secondary | ICD-10-CM | POA: Diagnosis not present

## 2021-07-12 DIAGNOSIS — N2581 Secondary hyperparathyroidism of renal origin: Secondary | ICD-10-CM | POA: Diagnosis not present

## 2021-07-12 DIAGNOSIS — N186 End stage renal disease: Secondary | ICD-10-CM | POA: Diagnosis not present

## 2021-07-13 DIAGNOSIS — N2581 Secondary hyperparathyroidism of renal origin: Secondary | ICD-10-CM | POA: Diagnosis not present

## 2021-07-13 DIAGNOSIS — N186 End stage renal disease: Secondary | ICD-10-CM | POA: Diagnosis not present

## 2021-07-14 DIAGNOSIS — N186 End stage renal disease: Secondary | ICD-10-CM | POA: Diagnosis not present

## 2021-07-14 DIAGNOSIS — N2581 Secondary hyperparathyroidism of renal origin: Secondary | ICD-10-CM | POA: Diagnosis not present

## 2021-07-15 DIAGNOSIS — N186 End stage renal disease: Secondary | ICD-10-CM | POA: Diagnosis not present

## 2021-07-15 DIAGNOSIS — N2581 Secondary hyperparathyroidism of renal origin: Secondary | ICD-10-CM | POA: Diagnosis not present

## 2021-07-16 DIAGNOSIS — N186 End stage renal disease: Secondary | ICD-10-CM | POA: Diagnosis not present

## 2021-07-16 DIAGNOSIS — N2581 Secondary hyperparathyroidism of renal origin: Secondary | ICD-10-CM | POA: Diagnosis not present

## 2021-07-17 DIAGNOSIS — N186 End stage renal disease: Secondary | ICD-10-CM | POA: Diagnosis not present

## 2021-07-17 DIAGNOSIS — N2581 Secondary hyperparathyroidism of renal origin: Secondary | ICD-10-CM | POA: Diagnosis not present

## 2021-07-18 DIAGNOSIS — N2581 Secondary hyperparathyroidism of renal origin: Secondary | ICD-10-CM | POA: Diagnosis not present

## 2021-07-18 DIAGNOSIS — N186 End stage renal disease: Secondary | ICD-10-CM | POA: Diagnosis not present

## 2021-07-19 DIAGNOSIS — N186 End stage renal disease: Secondary | ICD-10-CM | POA: Diagnosis not present

## 2021-07-19 DIAGNOSIS — N2581 Secondary hyperparathyroidism of renal origin: Secondary | ICD-10-CM | POA: Diagnosis not present

## 2021-07-20 DIAGNOSIS — N2581 Secondary hyperparathyroidism of renal origin: Secondary | ICD-10-CM | POA: Diagnosis not present

## 2021-07-20 DIAGNOSIS — N186 End stage renal disease: Secondary | ICD-10-CM | POA: Diagnosis not present

## 2021-07-21 DIAGNOSIS — N186 End stage renal disease: Secondary | ICD-10-CM | POA: Diagnosis not present

## 2021-07-21 DIAGNOSIS — N2581 Secondary hyperparathyroidism of renal origin: Secondary | ICD-10-CM | POA: Diagnosis not present

## 2021-07-22 DIAGNOSIS — N186 End stage renal disease: Secondary | ICD-10-CM | POA: Diagnosis not present

## 2021-07-22 DIAGNOSIS — N2581 Secondary hyperparathyroidism of renal origin: Secondary | ICD-10-CM | POA: Diagnosis not present

## 2021-07-23 DIAGNOSIS — N186 End stage renal disease: Secondary | ICD-10-CM | POA: Diagnosis not present

## 2021-07-23 DIAGNOSIS — I10 Essential (primary) hypertension: Secondary | ICD-10-CM | POA: Diagnosis not present

## 2021-07-23 DIAGNOSIS — D631 Anemia in chronic kidney disease: Secondary | ICD-10-CM | POA: Diagnosis not present

## 2021-07-24 DIAGNOSIS — N186 End stage renal disease: Secondary | ICD-10-CM | POA: Diagnosis not present

## 2021-07-25 DIAGNOSIS — N186 End stage renal disease: Secondary | ICD-10-CM | POA: Diagnosis not present

## 2021-07-26 DIAGNOSIS — N186 End stage renal disease: Secondary | ICD-10-CM | POA: Diagnosis not present

## 2021-07-27 DIAGNOSIS — N186 End stage renal disease: Secondary | ICD-10-CM | POA: Diagnosis not present

## 2021-07-28 DIAGNOSIS — N186 End stage renal disease: Secondary | ICD-10-CM | POA: Diagnosis not present

## 2021-07-29 DIAGNOSIS — N186 End stage renal disease: Secondary | ICD-10-CM | POA: Diagnosis not present

## 2021-07-30 DIAGNOSIS — N186 End stage renal disease: Secondary | ICD-10-CM | POA: Diagnosis not present

## 2021-07-31 DIAGNOSIS — N186 End stage renal disease: Secondary | ICD-10-CM | POA: Diagnosis not present

## 2021-08-01 DIAGNOSIS — N186 End stage renal disease: Secondary | ICD-10-CM | POA: Diagnosis not present

## 2021-08-02 DIAGNOSIS — N186 End stage renal disease: Secondary | ICD-10-CM | POA: Diagnosis not present

## 2021-08-02 DIAGNOSIS — D631 Anemia in chronic kidney disease: Secondary | ICD-10-CM | POA: Diagnosis not present

## 2021-08-02 DIAGNOSIS — D509 Iron deficiency anemia, unspecified: Secondary | ICD-10-CM | POA: Diagnosis not present

## 2021-08-02 DIAGNOSIS — N2581 Secondary hyperparathyroidism of renal origin: Secondary | ICD-10-CM | POA: Diagnosis not present

## 2021-08-03 DIAGNOSIS — N2581 Secondary hyperparathyroidism of renal origin: Secondary | ICD-10-CM | POA: Diagnosis not present

## 2021-08-03 DIAGNOSIS — N186 End stage renal disease: Secondary | ICD-10-CM | POA: Diagnosis not present

## 2021-08-03 DIAGNOSIS — D631 Anemia in chronic kidney disease: Secondary | ICD-10-CM | POA: Diagnosis not present

## 2021-08-03 DIAGNOSIS — D509 Iron deficiency anemia, unspecified: Secondary | ICD-10-CM | POA: Diagnosis not present

## 2021-08-04 DIAGNOSIS — N186 End stage renal disease: Secondary | ICD-10-CM | POA: Diagnosis not present

## 2021-08-04 DIAGNOSIS — D509 Iron deficiency anemia, unspecified: Secondary | ICD-10-CM | POA: Diagnosis not present

## 2021-08-04 DIAGNOSIS — N2581 Secondary hyperparathyroidism of renal origin: Secondary | ICD-10-CM | POA: Diagnosis not present

## 2021-08-04 DIAGNOSIS — D631 Anemia in chronic kidney disease: Secondary | ICD-10-CM | POA: Diagnosis not present

## 2021-08-05 DIAGNOSIS — N2581 Secondary hyperparathyroidism of renal origin: Secondary | ICD-10-CM | POA: Diagnosis not present

## 2021-08-05 DIAGNOSIS — D509 Iron deficiency anemia, unspecified: Secondary | ICD-10-CM | POA: Diagnosis not present

## 2021-08-05 DIAGNOSIS — D631 Anemia in chronic kidney disease: Secondary | ICD-10-CM | POA: Diagnosis not present

## 2021-08-05 DIAGNOSIS — N186 End stage renal disease: Secondary | ICD-10-CM | POA: Diagnosis not present

## 2021-08-06 DIAGNOSIS — D631 Anemia in chronic kidney disease: Secondary | ICD-10-CM | POA: Diagnosis not present

## 2021-08-06 DIAGNOSIS — N186 End stage renal disease: Secondary | ICD-10-CM | POA: Diagnosis not present

## 2021-08-06 DIAGNOSIS — N2581 Secondary hyperparathyroidism of renal origin: Secondary | ICD-10-CM | POA: Diagnosis not present

## 2021-08-06 DIAGNOSIS — D509 Iron deficiency anemia, unspecified: Secondary | ICD-10-CM | POA: Diagnosis not present

## 2021-08-07 DIAGNOSIS — N186 End stage renal disease: Secondary | ICD-10-CM | POA: Diagnosis not present

## 2021-08-07 DIAGNOSIS — N2581 Secondary hyperparathyroidism of renal origin: Secondary | ICD-10-CM | POA: Diagnosis not present

## 2021-08-07 DIAGNOSIS — D631 Anemia in chronic kidney disease: Secondary | ICD-10-CM | POA: Diagnosis not present

## 2021-08-07 DIAGNOSIS — D509 Iron deficiency anemia, unspecified: Secondary | ICD-10-CM | POA: Diagnosis not present

## 2021-08-08 DIAGNOSIS — N2581 Secondary hyperparathyroidism of renal origin: Secondary | ICD-10-CM | POA: Diagnosis not present

## 2021-08-08 DIAGNOSIS — N186 End stage renal disease: Secondary | ICD-10-CM | POA: Diagnosis not present

## 2021-08-08 DIAGNOSIS — D631 Anemia in chronic kidney disease: Secondary | ICD-10-CM | POA: Diagnosis not present

## 2021-08-08 DIAGNOSIS — D509 Iron deficiency anemia, unspecified: Secondary | ICD-10-CM | POA: Diagnosis not present

## 2021-08-09 DIAGNOSIS — D631 Anemia in chronic kidney disease: Secondary | ICD-10-CM | POA: Diagnosis not present

## 2021-08-09 DIAGNOSIS — N2581 Secondary hyperparathyroidism of renal origin: Secondary | ICD-10-CM | POA: Diagnosis not present

## 2021-08-09 DIAGNOSIS — N186 End stage renal disease: Secondary | ICD-10-CM | POA: Diagnosis not present

## 2021-08-09 DIAGNOSIS — D509 Iron deficiency anemia, unspecified: Secondary | ICD-10-CM | POA: Diagnosis not present

## 2021-08-10 DIAGNOSIS — D631 Anemia in chronic kidney disease: Secondary | ICD-10-CM | POA: Diagnosis not present

## 2021-08-10 DIAGNOSIS — N186 End stage renal disease: Secondary | ICD-10-CM | POA: Diagnosis not present

## 2021-08-10 DIAGNOSIS — N2581 Secondary hyperparathyroidism of renal origin: Secondary | ICD-10-CM | POA: Diagnosis not present

## 2021-08-10 DIAGNOSIS — D509 Iron deficiency anemia, unspecified: Secondary | ICD-10-CM | POA: Diagnosis not present

## 2021-08-11 DIAGNOSIS — N2581 Secondary hyperparathyroidism of renal origin: Secondary | ICD-10-CM | POA: Diagnosis not present

## 2021-08-11 DIAGNOSIS — N186 End stage renal disease: Secondary | ICD-10-CM | POA: Diagnosis not present

## 2021-08-11 DIAGNOSIS — D509 Iron deficiency anemia, unspecified: Secondary | ICD-10-CM | POA: Diagnosis not present

## 2021-08-11 DIAGNOSIS — D631 Anemia in chronic kidney disease: Secondary | ICD-10-CM | POA: Diagnosis not present

## 2021-08-12 DIAGNOSIS — N186 End stage renal disease: Secondary | ICD-10-CM | POA: Diagnosis not present

## 2021-08-12 DIAGNOSIS — N2581 Secondary hyperparathyroidism of renal origin: Secondary | ICD-10-CM | POA: Diagnosis not present

## 2021-08-13 DIAGNOSIS — N186 End stage renal disease: Secondary | ICD-10-CM | POA: Diagnosis not present

## 2021-08-13 DIAGNOSIS — N2581 Secondary hyperparathyroidism of renal origin: Secondary | ICD-10-CM | POA: Diagnosis not present

## 2021-08-14 DIAGNOSIS — N2581 Secondary hyperparathyroidism of renal origin: Secondary | ICD-10-CM | POA: Diagnosis not present

## 2021-08-14 DIAGNOSIS — N186 End stage renal disease: Secondary | ICD-10-CM | POA: Diagnosis not present

## 2021-08-15 DIAGNOSIS — N2581 Secondary hyperparathyroidism of renal origin: Secondary | ICD-10-CM | POA: Diagnosis not present

## 2021-08-15 DIAGNOSIS — N186 End stage renal disease: Secondary | ICD-10-CM | POA: Diagnosis not present

## 2021-08-16 DIAGNOSIS — N2581 Secondary hyperparathyroidism of renal origin: Secondary | ICD-10-CM | POA: Diagnosis not present

## 2021-08-16 DIAGNOSIS — N186 End stage renal disease: Secondary | ICD-10-CM | POA: Diagnosis not present

## 2021-08-17 DIAGNOSIS — N186 End stage renal disease: Secondary | ICD-10-CM | POA: Diagnosis not present

## 2021-08-17 DIAGNOSIS — N2581 Secondary hyperparathyroidism of renal origin: Secondary | ICD-10-CM | POA: Diagnosis not present

## 2021-08-18 DIAGNOSIS — N186 End stage renal disease: Secondary | ICD-10-CM | POA: Diagnosis not present

## 2021-08-18 DIAGNOSIS — N2581 Secondary hyperparathyroidism of renal origin: Secondary | ICD-10-CM | POA: Diagnosis not present

## 2021-08-19 DIAGNOSIS — N186 End stage renal disease: Secondary | ICD-10-CM | POA: Diagnosis not present

## 2021-08-19 DIAGNOSIS — N2581 Secondary hyperparathyroidism of renal origin: Secondary | ICD-10-CM | POA: Diagnosis not present

## 2021-08-20 DIAGNOSIS — N2581 Secondary hyperparathyroidism of renal origin: Secondary | ICD-10-CM | POA: Diagnosis not present

## 2021-08-20 DIAGNOSIS — N186 End stage renal disease: Secondary | ICD-10-CM | POA: Diagnosis not present

## 2021-08-21 DIAGNOSIS — N186 End stage renal disease: Secondary | ICD-10-CM | POA: Diagnosis not present

## 2021-08-21 DIAGNOSIS — N2581 Secondary hyperparathyroidism of renal origin: Secondary | ICD-10-CM | POA: Diagnosis not present

## 2021-08-22 DIAGNOSIS — I10 Essential (primary) hypertension: Secondary | ICD-10-CM | POA: Diagnosis not present

## 2021-08-22 DIAGNOSIS — N186 End stage renal disease: Secondary | ICD-10-CM | POA: Diagnosis not present

## 2021-08-22 DIAGNOSIS — D631 Anemia in chronic kidney disease: Secondary | ICD-10-CM | POA: Diagnosis not present

## 2021-08-23 DIAGNOSIS — N186 End stage renal disease: Secondary | ICD-10-CM | POA: Diagnosis not present

## 2021-08-24 DIAGNOSIS — N186 End stage renal disease: Secondary | ICD-10-CM | POA: Diagnosis not present

## 2021-08-25 DIAGNOSIS — N186 End stage renal disease: Secondary | ICD-10-CM | POA: Diagnosis not present

## 2021-08-26 DIAGNOSIS — N186 End stage renal disease: Secondary | ICD-10-CM | POA: Diagnosis not present

## 2021-08-27 DIAGNOSIS — N186 End stage renal disease: Secondary | ICD-10-CM | POA: Diagnosis not present

## 2021-08-28 DIAGNOSIS — N186 End stage renal disease: Secondary | ICD-10-CM | POA: Diagnosis not present

## 2021-08-29 DIAGNOSIS — N186 End stage renal disease: Secondary | ICD-10-CM | POA: Diagnosis not present

## 2021-08-30 DIAGNOSIS — N186 End stage renal disease: Secondary | ICD-10-CM | POA: Diagnosis not present

## 2021-08-31 DIAGNOSIS — N186 End stage renal disease: Secondary | ICD-10-CM | POA: Diagnosis not present

## 2021-09-01 DIAGNOSIS — N186 End stage renal disease: Secondary | ICD-10-CM | POA: Diagnosis not present

## 2021-09-01 DIAGNOSIS — D509 Iron deficiency anemia, unspecified: Secondary | ICD-10-CM | POA: Diagnosis not present

## 2021-09-01 DIAGNOSIS — D631 Anemia in chronic kidney disease: Secondary | ICD-10-CM | POA: Diagnosis not present

## 2021-09-01 DIAGNOSIS — N2581 Secondary hyperparathyroidism of renal origin: Secondary | ICD-10-CM | POA: Diagnosis not present

## 2021-09-02 ENCOUNTER — Other Ambulatory Visit: Payer: Self-pay | Admitting: Internal Medicine

## 2021-09-02 DIAGNOSIS — N2581 Secondary hyperparathyroidism of renal origin: Secondary | ICD-10-CM | POA: Diagnosis not present

## 2021-09-02 DIAGNOSIS — D509 Iron deficiency anemia, unspecified: Secondary | ICD-10-CM | POA: Diagnosis not present

## 2021-09-02 DIAGNOSIS — Z1231 Encounter for screening mammogram for malignant neoplasm of breast: Secondary | ICD-10-CM

## 2021-09-02 DIAGNOSIS — N186 End stage renal disease: Secondary | ICD-10-CM | POA: Diagnosis not present

## 2021-09-02 DIAGNOSIS — D631 Anemia in chronic kidney disease: Secondary | ICD-10-CM | POA: Diagnosis not present

## 2021-09-03 DIAGNOSIS — N2581 Secondary hyperparathyroidism of renal origin: Secondary | ICD-10-CM | POA: Diagnosis not present

## 2021-09-03 DIAGNOSIS — D631 Anemia in chronic kidney disease: Secondary | ICD-10-CM | POA: Diagnosis not present

## 2021-09-03 DIAGNOSIS — D509 Iron deficiency anemia, unspecified: Secondary | ICD-10-CM | POA: Diagnosis not present

## 2021-09-03 DIAGNOSIS — N186 End stage renal disease: Secondary | ICD-10-CM | POA: Diagnosis not present

## 2021-09-04 DIAGNOSIS — D631 Anemia in chronic kidney disease: Secondary | ICD-10-CM | POA: Diagnosis not present

## 2021-09-04 DIAGNOSIS — D509 Iron deficiency anemia, unspecified: Secondary | ICD-10-CM | POA: Diagnosis not present

## 2021-09-04 DIAGNOSIS — N2581 Secondary hyperparathyroidism of renal origin: Secondary | ICD-10-CM | POA: Diagnosis not present

## 2021-09-04 DIAGNOSIS — N186 End stage renal disease: Secondary | ICD-10-CM | POA: Diagnosis not present

## 2021-09-05 DIAGNOSIS — N186 End stage renal disease: Secondary | ICD-10-CM | POA: Diagnosis not present

## 2021-09-05 DIAGNOSIS — D631 Anemia in chronic kidney disease: Secondary | ICD-10-CM | POA: Diagnosis not present

## 2021-09-05 DIAGNOSIS — N2581 Secondary hyperparathyroidism of renal origin: Secondary | ICD-10-CM | POA: Diagnosis not present

## 2021-09-05 DIAGNOSIS — D509 Iron deficiency anemia, unspecified: Secondary | ICD-10-CM | POA: Diagnosis not present

## 2021-09-06 ENCOUNTER — Ambulatory Visit
Admission: RE | Admit: 2021-09-06 | Discharge: 2021-09-06 | Disposition: A | Discharge status: Home / Self Care | Payer: BC Managed Care – PPO | Source: Ambulatory Visit | Attending: Internal Medicine | Admitting: Internal Medicine

## 2021-09-06 DIAGNOSIS — Z1231 Encounter for screening mammogram for malignant neoplasm of breast: Secondary | ICD-10-CM | POA: Diagnosis not present

## 2021-09-06 DIAGNOSIS — N2581 Secondary hyperparathyroidism of renal origin: Secondary | ICD-10-CM | POA: Diagnosis not present

## 2021-09-06 DIAGNOSIS — N186 End stage renal disease: Secondary | ICD-10-CM | POA: Diagnosis not present

## 2021-09-06 DIAGNOSIS — D631 Anemia in chronic kidney disease: Secondary | ICD-10-CM | POA: Diagnosis not present

## 2021-09-06 DIAGNOSIS — D509 Iron deficiency anemia, unspecified: Secondary | ICD-10-CM | POA: Diagnosis not present

## 2021-09-07 DIAGNOSIS — N186 End stage renal disease: Secondary | ICD-10-CM | POA: Diagnosis not present

## 2021-09-07 DIAGNOSIS — D509 Iron deficiency anemia, unspecified: Secondary | ICD-10-CM | POA: Diagnosis not present

## 2021-09-07 DIAGNOSIS — N2581 Secondary hyperparathyroidism of renal origin: Secondary | ICD-10-CM | POA: Diagnosis not present

## 2021-09-07 DIAGNOSIS — D631 Anemia in chronic kidney disease: Secondary | ICD-10-CM | POA: Diagnosis not present

## 2021-09-08 DIAGNOSIS — N186 End stage renal disease: Secondary | ICD-10-CM | POA: Diagnosis not present

## 2021-09-08 DIAGNOSIS — D631 Anemia in chronic kidney disease: Secondary | ICD-10-CM | POA: Diagnosis not present

## 2021-09-08 DIAGNOSIS — N2581 Secondary hyperparathyroidism of renal origin: Secondary | ICD-10-CM | POA: Diagnosis not present

## 2021-09-08 DIAGNOSIS — D509 Iron deficiency anemia, unspecified: Secondary | ICD-10-CM | POA: Diagnosis not present

## 2021-09-09 DIAGNOSIS — D509 Iron deficiency anemia, unspecified: Secondary | ICD-10-CM | POA: Diagnosis not present

## 2021-09-09 DIAGNOSIS — D631 Anemia in chronic kidney disease: Secondary | ICD-10-CM | POA: Diagnosis not present

## 2021-09-09 DIAGNOSIS — N2581 Secondary hyperparathyroidism of renal origin: Secondary | ICD-10-CM | POA: Diagnosis not present

## 2021-09-09 DIAGNOSIS — N186 End stage renal disease: Secondary | ICD-10-CM | POA: Diagnosis not present

## 2021-09-10 DIAGNOSIS — N186 End stage renal disease: Secondary | ICD-10-CM | POA: Diagnosis not present

## 2021-09-10 DIAGNOSIS — D509 Iron deficiency anemia, unspecified: Secondary | ICD-10-CM | POA: Diagnosis not present

## 2021-09-10 DIAGNOSIS — D631 Anemia in chronic kidney disease: Secondary | ICD-10-CM | POA: Diagnosis not present

## 2021-09-10 DIAGNOSIS — N2581 Secondary hyperparathyroidism of renal origin: Secondary | ICD-10-CM | POA: Diagnosis not present

## 2021-09-11 DIAGNOSIS — N186 End stage renal disease: Secondary | ICD-10-CM | POA: Diagnosis not present

## 2021-09-11 DIAGNOSIS — N2581 Secondary hyperparathyroidism of renal origin: Secondary | ICD-10-CM | POA: Diagnosis not present

## 2021-09-12 DIAGNOSIS — N2581 Secondary hyperparathyroidism of renal origin: Secondary | ICD-10-CM | POA: Diagnosis not present

## 2021-09-12 DIAGNOSIS — N186 End stage renal disease: Secondary | ICD-10-CM | POA: Diagnosis not present

## 2021-09-13 DIAGNOSIS — N2581 Secondary hyperparathyroidism of renal origin: Secondary | ICD-10-CM | POA: Diagnosis not present

## 2021-09-13 DIAGNOSIS — N186 End stage renal disease: Secondary | ICD-10-CM | POA: Diagnosis not present

## 2021-09-13 DIAGNOSIS — N281 Cyst of kidney, acquired: Secondary | ICD-10-CM | POA: Diagnosis not present

## 2021-09-14 DIAGNOSIS — N186 End stage renal disease: Secondary | ICD-10-CM | POA: Diagnosis not present

## 2021-09-14 DIAGNOSIS — N2581 Secondary hyperparathyroidism of renal origin: Secondary | ICD-10-CM | POA: Diagnosis not present

## 2021-09-15 DIAGNOSIS — N186 End stage renal disease: Secondary | ICD-10-CM | POA: Diagnosis not present

## 2021-09-15 DIAGNOSIS — N2581 Secondary hyperparathyroidism of renal origin: Secondary | ICD-10-CM | POA: Diagnosis not present

## 2021-09-16 DIAGNOSIS — N186 End stage renal disease: Secondary | ICD-10-CM | POA: Diagnosis not present

## 2021-09-16 DIAGNOSIS — N2581 Secondary hyperparathyroidism of renal origin: Secondary | ICD-10-CM | POA: Diagnosis not present

## 2021-09-17 DIAGNOSIS — N186 End stage renal disease: Secondary | ICD-10-CM | POA: Diagnosis not present

## 2021-09-17 DIAGNOSIS — N2581 Secondary hyperparathyroidism of renal origin: Secondary | ICD-10-CM | POA: Diagnosis not present

## 2021-09-18 DIAGNOSIS — N2581 Secondary hyperparathyroidism of renal origin: Secondary | ICD-10-CM | POA: Diagnosis not present

## 2021-09-18 DIAGNOSIS — N186 End stage renal disease: Secondary | ICD-10-CM | POA: Diagnosis not present

## 2021-09-19 DIAGNOSIS — N186 End stage renal disease: Secondary | ICD-10-CM | POA: Diagnosis not present

## 2021-09-19 DIAGNOSIS — N2581 Secondary hyperparathyroidism of renal origin: Secondary | ICD-10-CM | POA: Diagnosis not present

## 2021-09-20 DIAGNOSIS — N2581 Secondary hyperparathyroidism of renal origin: Secondary | ICD-10-CM | POA: Diagnosis not present

## 2021-09-20 DIAGNOSIS — N186 End stage renal disease: Secondary | ICD-10-CM | POA: Diagnosis not present

## 2021-09-20 DIAGNOSIS — H9311 Tinnitus, right ear: Secondary | ICD-10-CM | POA: Diagnosis not present

## 2021-09-21 DIAGNOSIS — N186 End stage renal disease: Secondary | ICD-10-CM | POA: Diagnosis not present

## 2021-09-21 DIAGNOSIS — N2581 Secondary hyperparathyroidism of renal origin: Secondary | ICD-10-CM | POA: Diagnosis not present

## 2021-09-22 DIAGNOSIS — I10 Essential (primary) hypertension: Secondary | ICD-10-CM | POA: Diagnosis not present

## 2021-09-22 DIAGNOSIS — N2581 Secondary hyperparathyroidism of renal origin: Secondary | ICD-10-CM | POA: Diagnosis not present

## 2021-09-22 DIAGNOSIS — N186 End stage renal disease: Secondary | ICD-10-CM | POA: Diagnosis not present

## 2021-09-22 DIAGNOSIS — D631 Anemia in chronic kidney disease: Secondary | ICD-10-CM | POA: Diagnosis not present

## 2021-09-22 DIAGNOSIS — D509 Iron deficiency anemia, unspecified: Secondary | ICD-10-CM | POA: Diagnosis not present

## 2021-09-24 DIAGNOSIS — D509 Iron deficiency anemia, unspecified: Secondary | ICD-10-CM | POA: Diagnosis not present

## 2021-09-24 DIAGNOSIS — N186 End stage renal disease: Secondary | ICD-10-CM | POA: Diagnosis not present

## 2021-09-24 DIAGNOSIS — D631 Anemia in chronic kidney disease: Secondary | ICD-10-CM | POA: Diagnosis not present

## 2021-09-24 DIAGNOSIS — N2581 Secondary hyperparathyroidism of renal origin: Secondary | ICD-10-CM | POA: Diagnosis not present

## 2021-09-25 DIAGNOSIS — N2581 Secondary hyperparathyroidism of renal origin: Secondary | ICD-10-CM | POA: Diagnosis not present

## 2021-09-25 DIAGNOSIS — D631 Anemia in chronic kidney disease: Secondary | ICD-10-CM | POA: Diagnosis not present

## 2021-09-25 DIAGNOSIS — D509 Iron deficiency anemia, unspecified: Secondary | ICD-10-CM | POA: Diagnosis not present

## 2021-09-25 DIAGNOSIS — N186 End stage renal disease: Secondary | ICD-10-CM | POA: Diagnosis not present

## 2021-09-27 DIAGNOSIS — N186 End stage renal disease: Secondary | ICD-10-CM | POA: Diagnosis not present

## 2021-09-27 DIAGNOSIS — N2581 Secondary hyperparathyroidism of renal origin: Secondary | ICD-10-CM | POA: Diagnosis not present

## 2021-09-27 DIAGNOSIS — D509 Iron deficiency anemia, unspecified: Secondary | ICD-10-CM | POA: Diagnosis not present

## 2021-09-27 DIAGNOSIS — D631 Anemia in chronic kidney disease: Secondary | ICD-10-CM | POA: Diagnosis not present

## 2021-09-29 DIAGNOSIS — D631 Anemia in chronic kidney disease: Secondary | ICD-10-CM | POA: Diagnosis not present

## 2021-09-29 DIAGNOSIS — N186 End stage renal disease: Secondary | ICD-10-CM | POA: Diagnosis not present

## 2021-09-29 DIAGNOSIS — N2581 Secondary hyperparathyroidism of renal origin: Secondary | ICD-10-CM | POA: Diagnosis not present

## 2021-09-29 DIAGNOSIS — D509 Iron deficiency anemia, unspecified: Secondary | ICD-10-CM | POA: Diagnosis not present

## 2021-09-30 DIAGNOSIS — N2581 Secondary hyperparathyroidism of renal origin: Secondary | ICD-10-CM | POA: Diagnosis not present

## 2021-09-30 DIAGNOSIS — N186 End stage renal disease: Secondary | ICD-10-CM | POA: Diagnosis not present

## 2021-09-30 DIAGNOSIS — D509 Iron deficiency anemia, unspecified: Secondary | ICD-10-CM | POA: Diagnosis not present

## 2021-09-30 DIAGNOSIS — D631 Anemia in chronic kidney disease: Secondary | ICD-10-CM | POA: Diagnosis not present

## 2021-10-02 DIAGNOSIS — N186 End stage renal disease: Secondary | ICD-10-CM | POA: Diagnosis not present

## 2021-10-03 DIAGNOSIS — N186 End stage renal disease: Secondary | ICD-10-CM | POA: Diagnosis not present

## 2021-10-05 DIAGNOSIS — N186 End stage renal disease: Secondary | ICD-10-CM | POA: Diagnosis not present

## 2021-10-09 DIAGNOSIS — N186 End stage renal disease: Secondary | ICD-10-CM | POA: Diagnosis not present

## 2021-10-10 DIAGNOSIS — N186 End stage renal disease: Secondary | ICD-10-CM | POA: Diagnosis not present

## 2021-10-13 DIAGNOSIS — N186 End stage renal disease: Secondary | ICD-10-CM | POA: Diagnosis not present

## 2021-10-16 DIAGNOSIS — N186 End stage renal disease: Secondary | ICD-10-CM | POA: Diagnosis not present

## 2021-10-17 DIAGNOSIS — N186 End stage renal disease: Secondary | ICD-10-CM | POA: Diagnosis not present

## 2021-10-18 DIAGNOSIS — N186 End stage renal disease: Secondary | ICD-10-CM | POA: Diagnosis not present

## 2021-10-19 DIAGNOSIS — N186 End stage renal disease: Secondary | ICD-10-CM | POA: Diagnosis not present

## 2021-10-22 DIAGNOSIS — N186 End stage renal disease: Secondary | ICD-10-CM | POA: Diagnosis not present

## 2021-10-22 DIAGNOSIS — D631 Anemia in chronic kidney disease: Secondary | ICD-10-CM | POA: Diagnosis not present

## 2021-10-22 DIAGNOSIS — I10 Essential (primary) hypertension: Secondary | ICD-10-CM | POA: Diagnosis not present

## 2021-10-23 DIAGNOSIS — N186 End stage renal disease: Secondary | ICD-10-CM | POA: Diagnosis not present

## 2021-10-26 DIAGNOSIS — N186 End stage renal disease: Secondary | ICD-10-CM | POA: Diagnosis not present

## 2021-10-30 DIAGNOSIS — N186 End stage renal disease: Secondary | ICD-10-CM | POA: Diagnosis not present

## 2021-10-31 DIAGNOSIS — N186 End stage renal disease: Secondary | ICD-10-CM | POA: Diagnosis not present

## 2021-11-02 DIAGNOSIS — D509 Iron deficiency anemia, unspecified: Secondary | ICD-10-CM | POA: Diagnosis not present

## 2021-11-02 DIAGNOSIS — N186 End stage renal disease: Secondary | ICD-10-CM | POA: Diagnosis not present

## 2021-11-02 DIAGNOSIS — D631 Anemia in chronic kidney disease: Secondary | ICD-10-CM | POA: Diagnosis not present

## 2021-11-02 DIAGNOSIS — N2581 Secondary hyperparathyroidism of renal origin: Secondary | ICD-10-CM | POA: Diagnosis not present

## 2021-11-03 DIAGNOSIS — N2581 Secondary hyperparathyroidism of renal origin: Secondary | ICD-10-CM | POA: Diagnosis not present

## 2021-11-03 DIAGNOSIS — D509 Iron deficiency anemia, unspecified: Secondary | ICD-10-CM | POA: Diagnosis not present

## 2021-11-03 DIAGNOSIS — D631 Anemia in chronic kidney disease: Secondary | ICD-10-CM | POA: Diagnosis not present

## 2021-11-03 DIAGNOSIS — N186 End stage renal disease: Secondary | ICD-10-CM | POA: Diagnosis not present

## 2021-11-06 DIAGNOSIS — N2581 Secondary hyperparathyroidism of renal origin: Secondary | ICD-10-CM | POA: Diagnosis not present

## 2021-11-06 DIAGNOSIS — D509 Iron deficiency anemia, unspecified: Secondary | ICD-10-CM | POA: Diagnosis not present

## 2021-11-06 DIAGNOSIS — D631 Anemia in chronic kidney disease: Secondary | ICD-10-CM | POA: Diagnosis not present

## 2021-11-06 DIAGNOSIS — N186 End stage renal disease: Secondary | ICD-10-CM | POA: Diagnosis not present

## 2021-11-08 DIAGNOSIS — N2581 Secondary hyperparathyroidism of renal origin: Secondary | ICD-10-CM | POA: Diagnosis not present

## 2021-11-08 DIAGNOSIS — D631 Anemia in chronic kidney disease: Secondary | ICD-10-CM | POA: Diagnosis not present

## 2021-11-08 DIAGNOSIS — D509 Iron deficiency anemia, unspecified: Secondary | ICD-10-CM | POA: Diagnosis not present

## 2021-11-08 DIAGNOSIS — N186 End stage renal disease: Secondary | ICD-10-CM | POA: Diagnosis not present

## 2021-11-09 DIAGNOSIS — Z01818 Encounter for other preprocedural examination: Secondary | ICD-10-CM | POA: Diagnosis not present

## 2021-11-13 DIAGNOSIS — N186 End stage renal disease: Secondary | ICD-10-CM | POA: Diagnosis not present

## 2021-11-15 DIAGNOSIS — N186 End stage renal disease: Secondary | ICD-10-CM | POA: Diagnosis not present

## 2021-11-16 DIAGNOSIS — N186 End stage renal disease: Secondary | ICD-10-CM | POA: Diagnosis not present

## 2021-11-17 DIAGNOSIS — N186 End stage renal disease: Secondary | ICD-10-CM | POA: Diagnosis not present

## 2021-11-18 DIAGNOSIS — N186 End stage renal disease: Secondary | ICD-10-CM | POA: Diagnosis not present

## 2021-11-19 DIAGNOSIS — N186 End stage renal disease: Secondary | ICD-10-CM | POA: Diagnosis not present

## 2021-11-20 DIAGNOSIS — N186 End stage renal disease: Secondary | ICD-10-CM | POA: Diagnosis not present

## 2021-11-21 DIAGNOSIS — N186 End stage renal disease: Secondary | ICD-10-CM | POA: Diagnosis not present

## 2021-11-22 DIAGNOSIS — I10 Essential (primary) hypertension: Secondary | ICD-10-CM | POA: Diagnosis not present

## 2021-11-22 DIAGNOSIS — N186 End stage renal disease: Secondary | ICD-10-CM | POA: Diagnosis not present

## 2021-11-22 DIAGNOSIS — D631 Anemia in chronic kidney disease: Secondary | ICD-10-CM | POA: Diagnosis not present

## 2021-11-23 DIAGNOSIS — N186 End stage renal disease: Secondary | ICD-10-CM | POA: Diagnosis not present

## 2021-11-24 DIAGNOSIS — N186 End stage renal disease: Secondary | ICD-10-CM | POA: Diagnosis not present

## 2021-11-25 DIAGNOSIS — N186 End stage renal disease: Secondary | ICD-10-CM | POA: Diagnosis not present

## 2021-11-26 DIAGNOSIS — N186 End stage renal disease: Secondary | ICD-10-CM | POA: Diagnosis not present

## 2021-11-27 DIAGNOSIS — N186 End stage renal disease: Secondary | ICD-10-CM | POA: Diagnosis not present

## 2021-11-28 DIAGNOSIS — R899 Unspecified abnormal finding in specimens from other organs, systems and tissues: Secondary | ICD-10-CM | POA: Diagnosis not present

## 2021-11-29 DIAGNOSIS — Z1211 Encounter for screening for malignant neoplasm of colon: Secondary | ICD-10-CM | POA: Diagnosis not present

## 2021-11-29 DIAGNOSIS — K6389 Other specified diseases of intestine: Secondary | ICD-10-CM | POA: Diagnosis not present

## 2021-11-29 DIAGNOSIS — K5730 Diverticulosis of large intestine without perforation or abscess without bleeding: Secondary | ICD-10-CM | POA: Diagnosis not present

## 2021-11-29 DIAGNOSIS — D125 Benign neoplasm of sigmoid colon: Secondary | ICD-10-CM | POA: Diagnosis not present

## 2021-11-29 DIAGNOSIS — N186 End stage renal disease: Secondary | ICD-10-CM | POA: Diagnosis not present

## 2021-11-29 DIAGNOSIS — K648 Other hemorrhoids: Secondary | ICD-10-CM | POA: Diagnosis not present

## 2021-12-01 DIAGNOSIS — N186 End stage renal disease: Secondary | ICD-10-CM | POA: Diagnosis not present

## 2021-12-02 DIAGNOSIS — D509 Iron deficiency anemia, unspecified: Secondary | ICD-10-CM | POA: Diagnosis not present

## 2021-12-02 DIAGNOSIS — N2581 Secondary hyperparathyroidism of renal origin: Secondary | ICD-10-CM | POA: Diagnosis not present

## 2021-12-02 DIAGNOSIS — N186 End stage renal disease: Secondary | ICD-10-CM | POA: Diagnosis not present

## 2021-12-02 DIAGNOSIS — Z114 Encounter for screening for human immunodeficiency virus [HIV]: Secondary | ICD-10-CM | POA: Diagnosis not present

## 2021-12-02 DIAGNOSIS — D631 Anemia in chronic kidney disease: Secondary | ICD-10-CM | POA: Diagnosis not present

## 2021-12-03 DIAGNOSIS — N2581 Secondary hyperparathyroidism of renal origin: Secondary | ICD-10-CM | POA: Diagnosis not present

## 2021-12-03 DIAGNOSIS — N186 End stage renal disease: Secondary | ICD-10-CM | POA: Diagnosis not present

## 2021-12-03 DIAGNOSIS — D631 Anemia in chronic kidney disease: Secondary | ICD-10-CM | POA: Diagnosis not present

## 2021-12-03 DIAGNOSIS — D509 Iron deficiency anemia, unspecified: Secondary | ICD-10-CM | POA: Diagnosis not present

## 2021-12-04 DIAGNOSIS — N2581 Secondary hyperparathyroidism of renal origin: Secondary | ICD-10-CM | POA: Diagnosis not present

## 2021-12-04 DIAGNOSIS — N186 End stage renal disease: Secondary | ICD-10-CM | POA: Diagnosis not present

## 2021-12-04 DIAGNOSIS — D631 Anemia in chronic kidney disease: Secondary | ICD-10-CM | POA: Diagnosis not present

## 2021-12-04 DIAGNOSIS — D509 Iron deficiency anemia, unspecified: Secondary | ICD-10-CM | POA: Diagnosis not present

## 2021-12-05 DIAGNOSIS — D509 Iron deficiency anemia, unspecified: Secondary | ICD-10-CM | POA: Diagnosis not present

## 2021-12-05 DIAGNOSIS — N186 End stage renal disease: Secondary | ICD-10-CM | POA: Diagnosis not present

## 2021-12-05 DIAGNOSIS — D631 Anemia in chronic kidney disease: Secondary | ICD-10-CM | POA: Diagnosis not present

## 2021-12-05 DIAGNOSIS — N2581 Secondary hyperparathyroidism of renal origin: Secondary | ICD-10-CM | POA: Diagnosis not present

## 2021-12-06 DIAGNOSIS — N186 End stage renal disease: Secondary | ICD-10-CM | POA: Diagnosis not present

## 2021-12-06 DIAGNOSIS — D631 Anemia in chronic kidney disease: Secondary | ICD-10-CM | POA: Diagnosis not present

## 2021-12-06 DIAGNOSIS — N2581 Secondary hyperparathyroidism of renal origin: Secondary | ICD-10-CM | POA: Diagnosis not present

## 2021-12-06 DIAGNOSIS — D509 Iron deficiency anemia, unspecified: Secondary | ICD-10-CM | POA: Diagnosis not present

## 2021-12-07 DIAGNOSIS — D631 Anemia in chronic kidney disease: Secondary | ICD-10-CM | POA: Diagnosis not present

## 2021-12-07 DIAGNOSIS — N186 End stage renal disease: Secondary | ICD-10-CM | POA: Diagnosis not present

## 2021-12-07 DIAGNOSIS — D509 Iron deficiency anemia, unspecified: Secondary | ICD-10-CM | POA: Diagnosis not present

## 2021-12-07 DIAGNOSIS — N2581 Secondary hyperparathyroidism of renal origin: Secondary | ICD-10-CM | POA: Diagnosis not present

## 2021-12-08 DIAGNOSIS — N186 End stage renal disease: Secondary | ICD-10-CM | POA: Diagnosis not present

## 2021-12-08 DIAGNOSIS — D509 Iron deficiency anemia, unspecified: Secondary | ICD-10-CM | POA: Diagnosis not present

## 2021-12-08 DIAGNOSIS — D631 Anemia in chronic kidney disease: Secondary | ICD-10-CM | POA: Diagnosis not present

## 2021-12-08 DIAGNOSIS — N2581 Secondary hyperparathyroidism of renal origin: Secondary | ICD-10-CM | POA: Diagnosis not present

## 2021-12-09 DIAGNOSIS — N186 End stage renal disease: Secondary | ICD-10-CM | POA: Diagnosis not present

## 2021-12-09 DIAGNOSIS — D631 Anemia in chronic kidney disease: Secondary | ICD-10-CM | POA: Diagnosis not present

## 2021-12-09 DIAGNOSIS — N2581 Secondary hyperparathyroidism of renal origin: Secondary | ICD-10-CM | POA: Diagnosis not present

## 2021-12-09 DIAGNOSIS — D509 Iron deficiency anemia, unspecified: Secondary | ICD-10-CM | POA: Diagnosis not present

## 2021-12-10 DIAGNOSIS — N2581 Secondary hyperparathyroidism of renal origin: Secondary | ICD-10-CM | POA: Diagnosis not present

## 2021-12-10 DIAGNOSIS — N186 End stage renal disease: Secondary | ICD-10-CM | POA: Diagnosis not present

## 2021-12-10 DIAGNOSIS — D631 Anemia in chronic kidney disease: Secondary | ICD-10-CM | POA: Diagnosis not present

## 2021-12-10 DIAGNOSIS — D509 Iron deficiency anemia, unspecified: Secondary | ICD-10-CM | POA: Diagnosis not present

## 2021-12-11 DIAGNOSIS — D631 Anemia in chronic kidney disease: Secondary | ICD-10-CM | POA: Diagnosis not present

## 2021-12-11 DIAGNOSIS — D509 Iron deficiency anemia, unspecified: Secondary | ICD-10-CM | POA: Diagnosis not present

## 2021-12-11 DIAGNOSIS — N186 End stage renal disease: Secondary | ICD-10-CM | POA: Diagnosis not present

## 2021-12-11 DIAGNOSIS — N2581 Secondary hyperparathyroidism of renal origin: Secondary | ICD-10-CM | POA: Diagnosis not present

## 2021-12-12 DIAGNOSIS — N186 End stage renal disease: Secondary | ICD-10-CM | POA: Diagnosis not present

## 2021-12-13 DIAGNOSIS — N186 End stage renal disease: Secondary | ICD-10-CM | POA: Diagnosis not present

## 2021-12-14 DIAGNOSIS — N186 End stage renal disease: Secondary | ICD-10-CM | POA: Diagnosis not present

## 2021-12-15 DIAGNOSIS — N186 End stage renal disease: Secondary | ICD-10-CM | POA: Diagnosis not present

## 2021-12-16 DIAGNOSIS — N186 End stage renal disease: Secondary | ICD-10-CM | POA: Diagnosis not present

## 2021-12-16 IMAGING — CR DG FOOT COMPLETE 3+V*R*
3 series · 3 of 3 positions shown · non-contrast
Comparison: None.

CLINICAL DATA: Right foot pain, gout

EXAM:
RIGHT FOOT COMPLETE - 3+ VIEW

[t foot ap right]
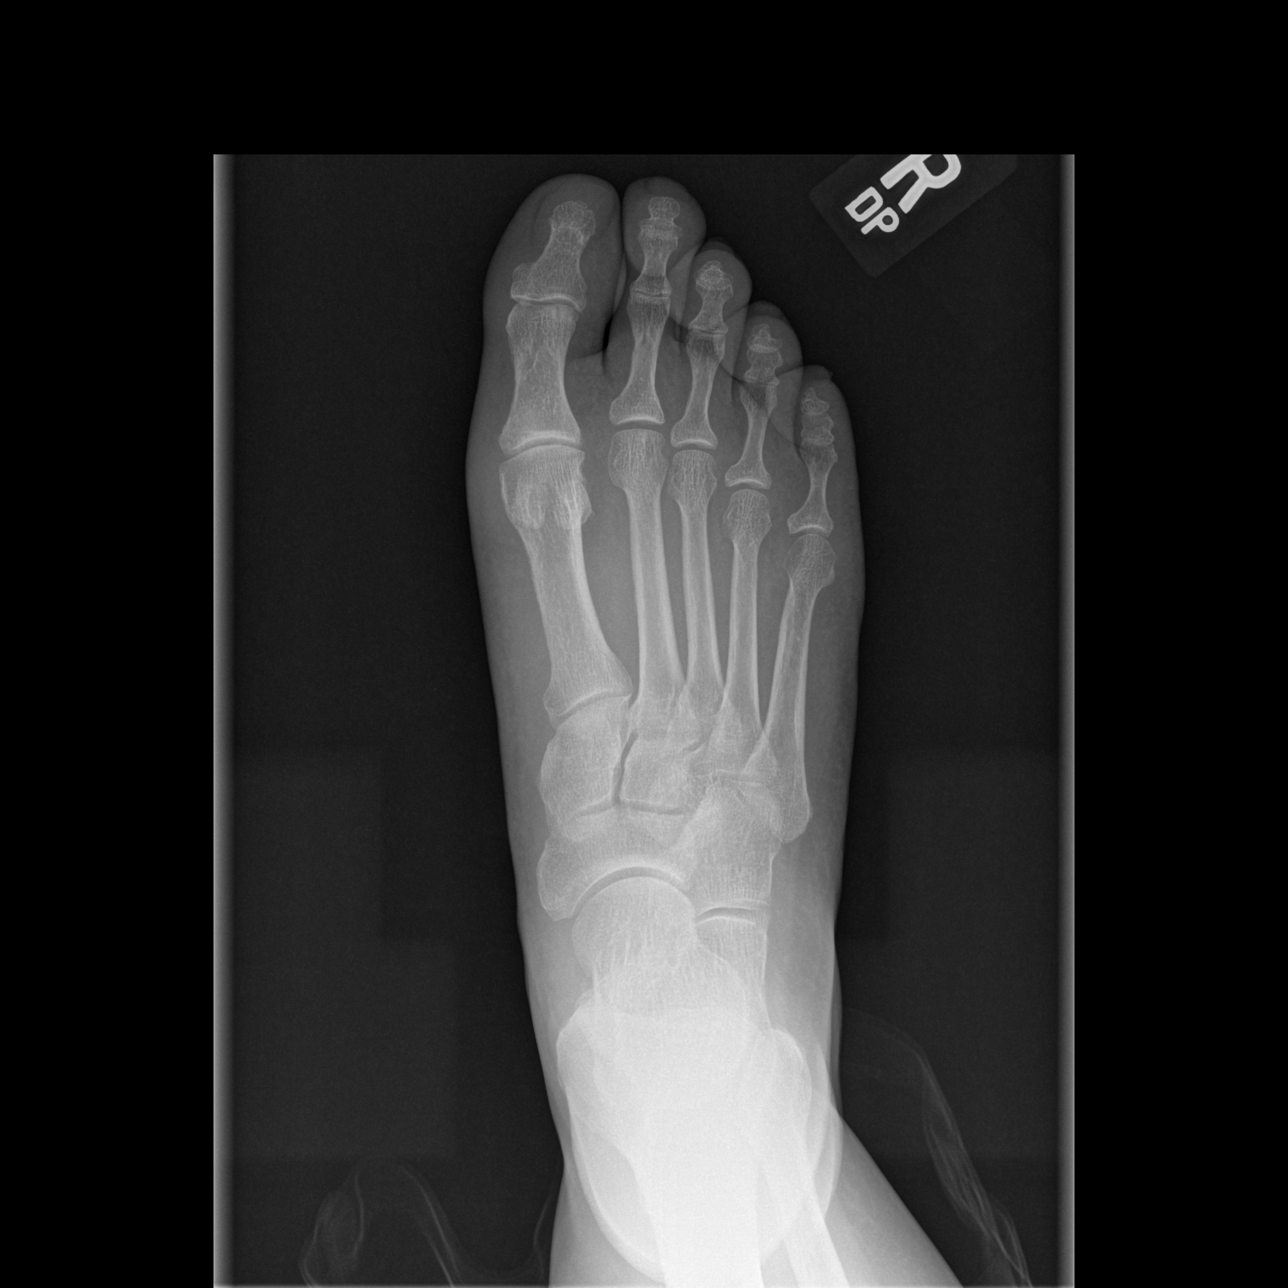

[t foot oblique right]
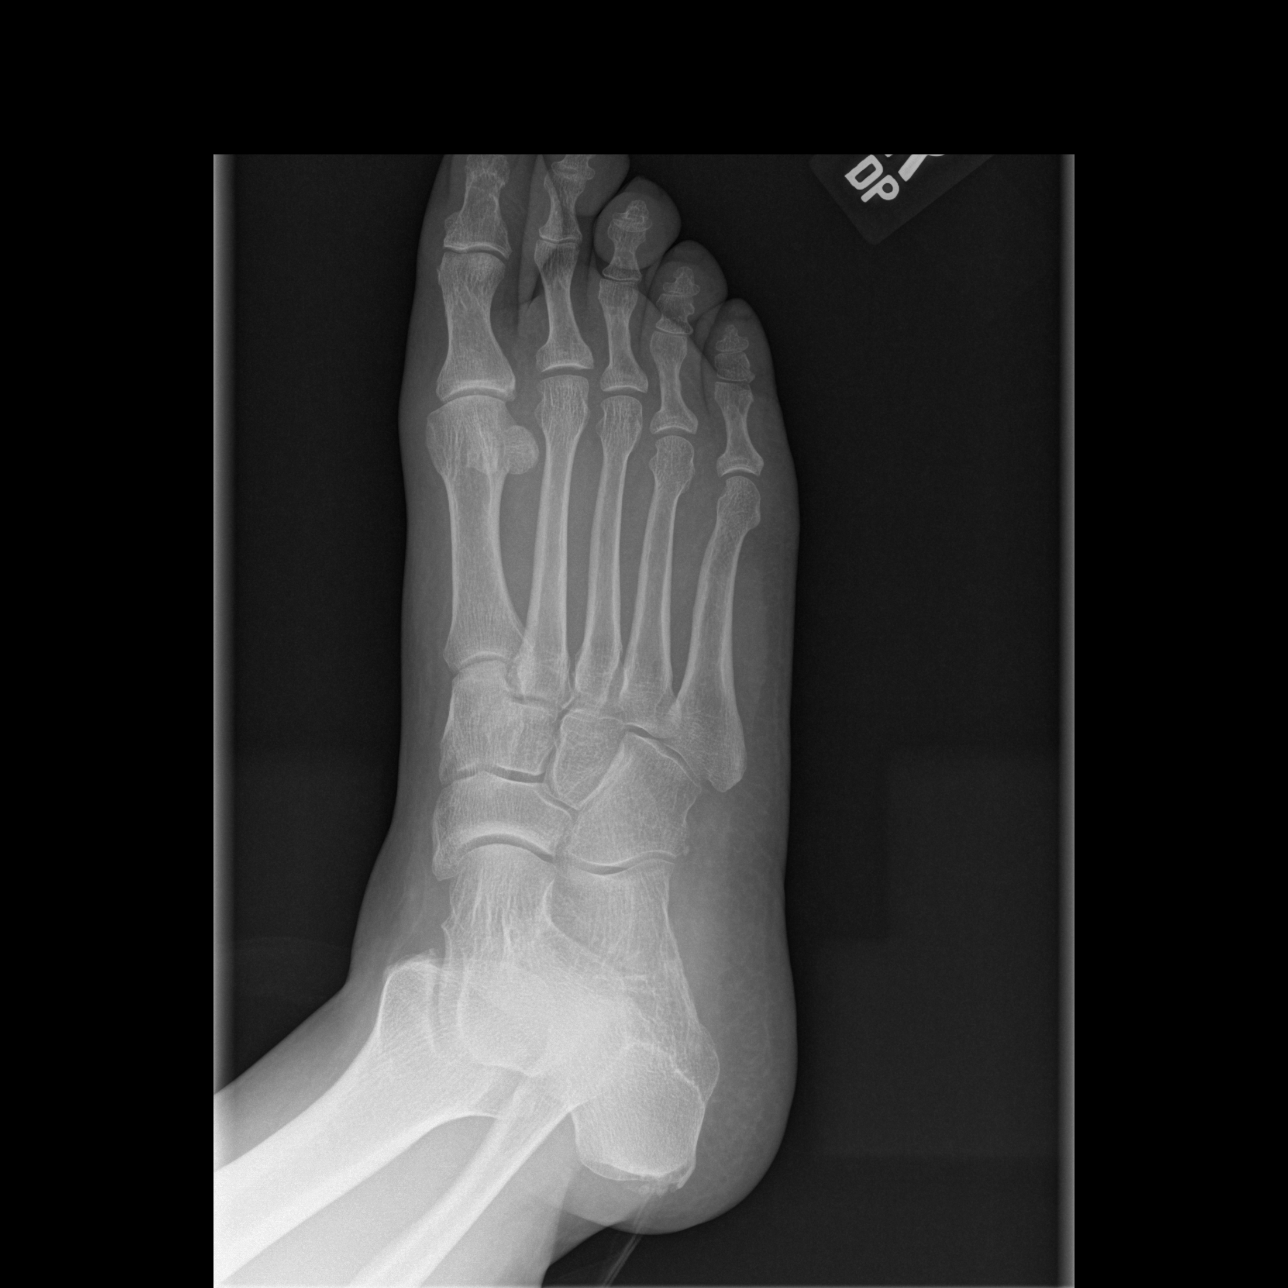

[t foot lat right]
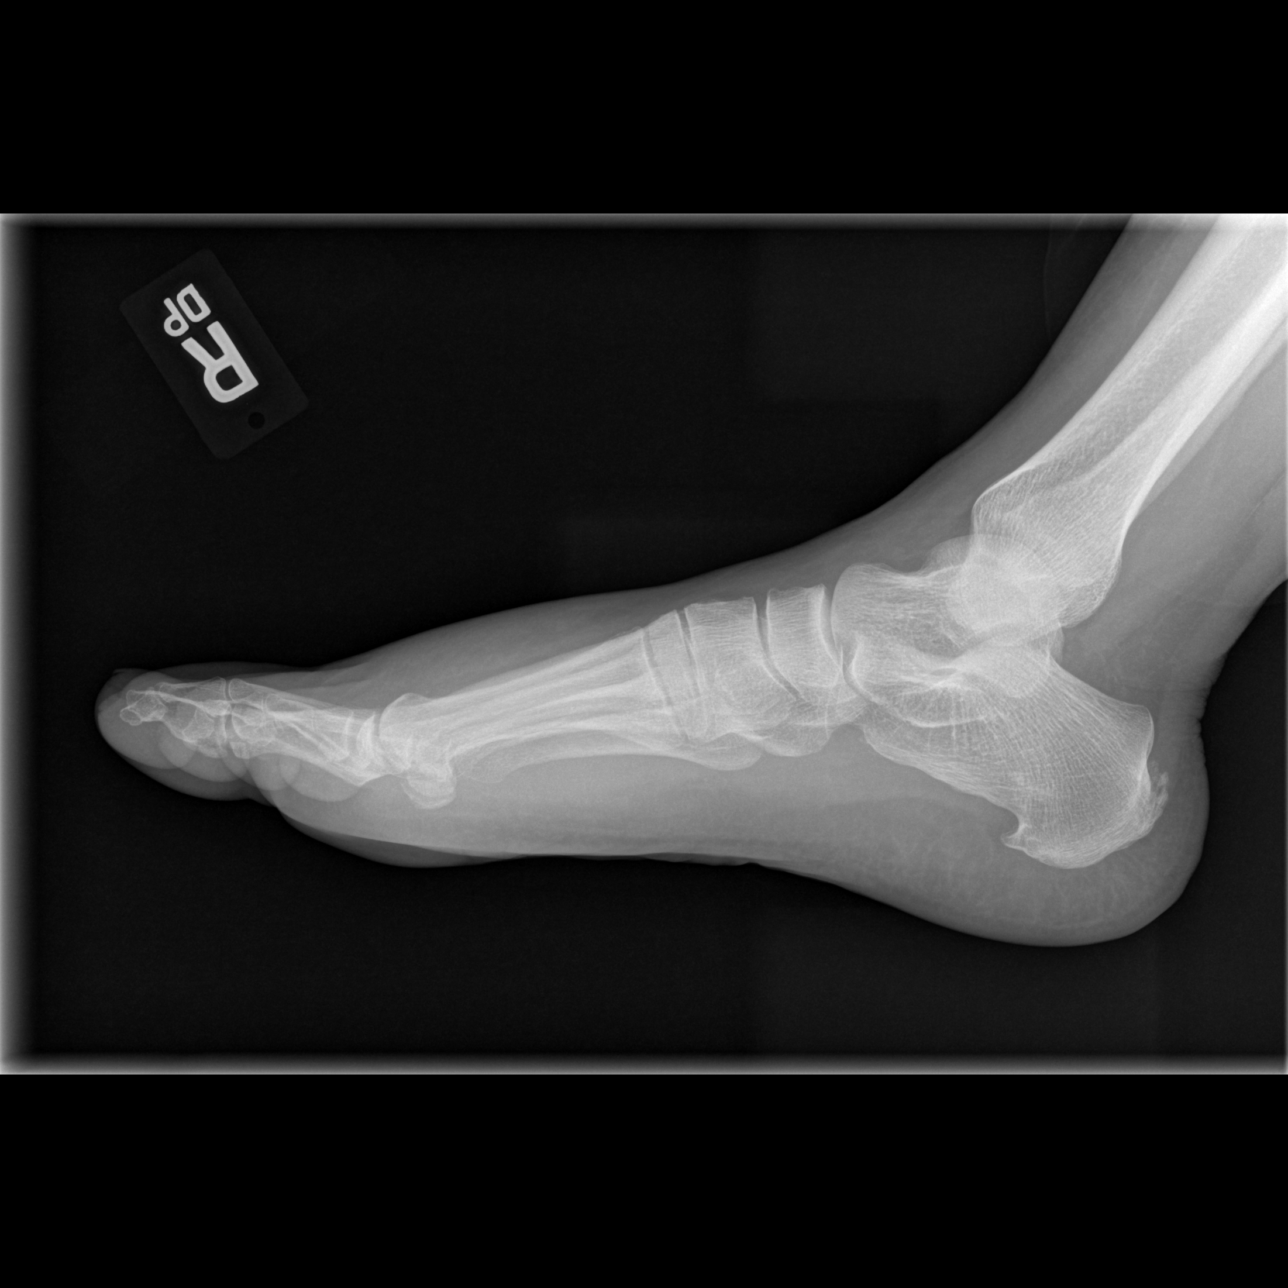

[3 of 3 positions shown; findings below may reference images not displayed]

FINDINGS: Three view radiograph right foot demonstrates normal alignment. No
fracture or dislocation. Joint spaces are preserved. There is
moderate soft tissue swelling of the right forefoot. Small plantar
calcaneal spur is present.
IMPRESSION: Moderate right forefoot soft tissue swelling.

## 2021-12-17 DIAGNOSIS — N186 End stage renal disease: Secondary | ICD-10-CM | POA: Diagnosis not present

## 2021-12-18 DIAGNOSIS — N186 End stage renal disease: Secondary | ICD-10-CM | POA: Diagnosis not present

## 2021-12-19 DIAGNOSIS — N186 End stage renal disease: Secondary | ICD-10-CM | POA: Diagnosis not present

## 2021-12-20 DIAGNOSIS — N186 End stage renal disease: Secondary | ICD-10-CM | POA: Diagnosis not present

## 2021-12-21 DIAGNOSIS — N186 End stage renal disease: Secondary | ICD-10-CM | POA: Diagnosis not present

## 2021-12-22 DIAGNOSIS — N186 End stage renal disease: Secondary | ICD-10-CM | POA: Diagnosis not present

## 2021-12-23 DIAGNOSIS — I10 Essential (primary) hypertension: Secondary | ICD-10-CM | POA: Diagnosis not present

## 2021-12-23 DIAGNOSIS — N186 End stage renal disease: Secondary | ICD-10-CM | POA: Diagnosis not present

## 2021-12-23 DIAGNOSIS — D509 Iron deficiency anemia, unspecified: Secondary | ICD-10-CM | POA: Diagnosis not present

## 2021-12-23 DIAGNOSIS — D631 Anemia in chronic kidney disease: Secondary | ICD-10-CM | POA: Diagnosis not present

## 2021-12-23 DIAGNOSIS — N2581 Secondary hyperparathyroidism of renal origin: Secondary | ICD-10-CM | POA: Diagnosis not present

## 2021-12-24 DIAGNOSIS — I10 Essential (primary) hypertension: Secondary | ICD-10-CM | POA: Diagnosis not present

## 2021-12-24 DIAGNOSIS — N2581 Secondary hyperparathyroidism of renal origin: Secondary | ICD-10-CM | POA: Diagnosis not present

## 2021-12-24 DIAGNOSIS — N186 End stage renal disease: Secondary | ICD-10-CM | POA: Diagnosis not present

## 2021-12-24 DIAGNOSIS — D509 Iron deficiency anemia, unspecified: Secondary | ICD-10-CM | POA: Diagnosis not present

## 2021-12-24 DIAGNOSIS — D631 Anemia in chronic kidney disease: Secondary | ICD-10-CM | POA: Diagnosis not present

## 2021-12-25 DIAGNOSIS — I10 Essential (primary) hypertension: Secondary | ICD-10-CM | POA: Diagnosis not present

## 2021-12-25 DIAGNOSIS — N2581 Secondary hyperparathyroidism of renal origin: Secondary | ICD-10-CM | POA: Diagnosis not present

## 2021-12-25 DIAGNOSIS — D509 Iron deficiency anemia, unspecified: Secondary | ICD-10-CM | POA: Diagnosis not present

## 2021-12-25 DIAGNOSIS — N186 End stage renal disease: Secondary | ICD-10-CM | POA: Diagnosis not present

## 2021-12-25 DIAGNOSIS — D631 Anemia in chronic kidney disease: Secondary | ICD-10-CM | POA: Diagnosis not present

## 2021-12-26 DIAGNOSIS — D631 Anemia in chronic kidney disease: Secondary | ICD-10-CM | POA: Diagnosis not present

## 2021-12-26 DIAGNOSIS — N2581 Secondary hyperparathyroidism of renal origin: Secondary | ICD-10-CM | POA: Diagnosis not present

## 2021-12-26 DIAGNOSIS — N186 End stage renal disease: Secondary | ICD-10-CM | POA: Diagnosis not present

## 2021-12-26 DIAGNOSIS — I10 Essential (primary) hypertension: Secondary | ICD-10-CM | POA: Diagnosis not present

## 2021-12-26 DIAGNOSIS — D509 Iron deficiency anemia, unspecified: Secondary | ICD-10-CM | POA: Diagnosis not present

## 2021-12-27 DIAGNOSIS — D509 Iron deficiency anemia, unspecified: Secondary | ICD-10-CM | POA: Diagnosis not present

## 2021-12-27 DIAGNOSIS — N186 End stage renal disease: Secondary | ICD-10-CM | POA: Diagnosis not present

## 2021-12-27 DIAGNOSIS — I10 Essential (primary) hypertension: Secondary | ICD-10-CM | POA: Diagnosis not present

## 2021-12-27 DIAGNOSIS — N2581 Secondary hyperparathyroidism of renal origin: Secondary | ICD-10-CM | POA: Diagnosis not present

## 2021-12-27 DIAGNOSIS — D631 Anemia in chronic kidney disease: Secondary | ICD-10-CM | POA: Diagnosis not present

## 2021-12-28 DIAGNOSIS — N2581 Secondary hyperparathyroidism of renal origin: Secondary | ICD-10-CM | POA: Diagnosis not present

## 2021-12-28 DIAGNOSIS — N186 End stage renal disease: Secondary | ICD-10-CM | POA: Diagnosis not present

## 2021-12-28 DIAGNOSIS — I10 Essential (primary) hypertension: Secondary | ICD-10-CM | POA: Diagnosis not present

## 2021-12-28 DIAGNOSIS — D509 Iron deficiency anemia, unspecified: Secondary | ICD-10-CM | POA: Diagnosis not present

## 2021-12-28 DIAGNOSIS — D631 Anemia in chronic kidney disease: Secondary | ICD-10-CM | POA: Diagnosis not present

## 2021-12-29 DIAGNOSIS — D631 Anemia in chronic kidney disease: Secondary | ICD-10-CM | POA: Diagnosis not present

## 2021-12-29 DIAGNOSIS — I10 Essential (primary) hypertension: Secondary | ICD-10-CM | POA: Diagnosis not present

## 2021-12-29 DIAGNOSIS — N186 End stage renal disease: Secondary | ICD-10-CM | POA: Diagnosis not present

## 2021-12-29 DIAGNOSIS — D509 Iron deficiency anemia, unspecified: Secondary | ICD-10-CM | POA: Diagnosis not present

## 2021-12-29 DIAGNOSIS — N2581 Secondary hyperparathyroidism of renal origin: Secondary | ICD-10-CM | POA: Diagnosis not present

## 2021-12-30 DIAGNOSIS — N2581 Secondary hyperparathyroidism of renal origin: Secondary | ICD-10-CM | POA: Diagnosis not present

## 2021-12-30 DIAGNOSIS — D509 Iron deficiency anemia, unspecified: Secondary | ICD-10-CM | POA: Diagnosis not present

## 2021-12-30 DIAGNOSIS — D631 Anemia in chronic kidney disease: Secondary | ICD-10-CM | POA: Diagnosis not present

## 2021-12-30 DIAGNOSIS — N186 End stage renal disease: Secondary | ICD-10-CM | POA: Diagnosis not present

## 2021-12-30 DIAGNOSIS — I10 Essential (primary) hypertension: Secondary | ICD-10-CM | POA: Diagnosis not present

## 2021-12-31 DIAGNOSIS — D631 Anemia in chronic kidney disease: Secondary | ICD-10-CM | POA: Diagnosis not present

## 2021-12-31 DIAGNOSIS — D509 Iron deficiency anemia, unspecified: Secondary | ICD-10-CM | POA: Diagnosis not present

## 2021-12-31 DIAGNOSIS — I10 Essential (primary) hypertension: Secondary | ICD-10-CM | POA: Diagnosis not present

## 2021-12-31 DIAGNOSIS — N2581 Secondary hyperparathyroidism of renal origin: Secondary | ICD-10-CM | POA: Diagnosis not present

## 2021-12-31 DIAGNOSIS — N186 End stage renal disease: Secondary | ICD-10-CM | POA: Diagnosis not present

## 2022-01-01 DIAGNOSIS — N2581 Secondary hyperparathyroidism of renal origin: Secondary | ICD-10-CM | POA: Diagnosis not present

## 2022-01-01 DIAGNOSIS — I10 Essential (primary) hypertension: Secondary | ICD-10-CM | POA: Diagnosis not present

## 2022-01-01 DIAGNOSIS — D509 Iron deficiency anemia, unspecified: Secondary | ICD-10-CM | POA: Diagnosis not present

## 2022-01-01 DIAGNOSIS — D631 Anemia in chronic kidney disease: Secondary | ICD-10-CM | POA: Diagnosis not present

## 2022-01-01 DIAGNOSIS — N186 End stage renal disease: Secondary | ICD-10-CM | POA: Diagnosis not present

## 2022-01-02 DIAGNOSIS — N186 End stage renal disease: Secondary | ICD-10-CM | POA: Diagnosis not present

## 2022-01-03 DIAGNOSIS — N186 End stage renal disease: Secondary | ICD-10-CM | POA: Diagnosis not present

## 2022-01-04 DIAGNOSIS — N186 End stage renal disease: Secondary | ICD-10-CM | POA: Diagnosis not present

## 2022-01-05 DIAGNOSIS — N186 End stage renal disease: Secondary | ICD-10-CM | POA: Diagnosis not present

## 2022-01-06 DIAGNOSIS — N186 End stage renal disease: Secondary | ICD-10-CM | POA: Diagnosis not present

## 2022-01-07 DIAGNOSIS — N186 End stage renal disease: Secondary | ICD-10-CM | POA: Diagnosis not present

## 2022-01-08 DIAGNOSIS — N186 End stage renal disease: Secondary | ICD-10-CM | POA: Diagnosis not present

## 2022-01-09 DIAGNOSIS — N186 End stage renal disease: Secondary | ICD-10-CM | POA: Diagnosis not present

## 2022-01-10 DIAGNOSIS — N186 End stage renal disease: Secondary | ICD-10-CM | POA: Diagnosis not present

## 2022-01-11 DIAGNOSIS — N186 End stage renal disease: Secondary | ICD-10-CM | POA: Diagnosis not present

## 2022-01-12 DIAGNOSIS — N186 End stage renal disease: Secondary | ICD-10-CM | POA: Diagnosis not present

## 2022-01-13 DIAGNOSIS — N186 End stage renal disease: Secondary | ICD-10-CM | POA: Diagnosis not present

## 2022-01-14 DIAGNOSIS — N186 End stage renal disease: Secondary | ICD-10-CM | POA: Diagnosis not present

## 2022-01-15 DIAGNOSIS — N186 End stage renal disease: Secondary | ICD-10-CM | POA: Diagnosis not present

## 2022-01-16 DIAGNOSIS — N186 End stage renal disease: Secondary | ICD-10-CM | POA: Diagnosis not present

## 2022-01-17 DIAGNOSIS — N186 End stage renal disease: Secondary | ICD-10-CM | POA: Diagnosis not present

## 2022-01-18 DIAGNOSIS — N186 End stage renal disease: Secondary | ICD-10-CM | POA: Diagnosis not present

## 2022-01-19 DIAGNOSIS — N186 End stage renal disease: Secondary | ICD-10-CM | POA: Diagnosis not present

## 2022-01-20 DIAGNOSIS — N186 End stage renal disease: Secondary | ICD-10-CM | POA: Diagnosis not present

## 2022-01-21 DIAGNOSIS — N186 End stage renal disease: Secondary | ICD-10-CM | POA: Diagnosis not present

## 2022-01-22 DIAGNOSIS — D631 Anemia in chronic kidney disease: Secondary | ICD-10-CM | POA: Diagnosis not present

## 2022-01-22 DIAGNOSIS — N186 End stage renal disease: Secondary | ICD-10-CM | POA: Diagnosis not present

## 2022-01-22 DIAGNOSIS — I10 Essential (primary) hypertension: Secondary | ICD-10-CM | POA: Diagnosis not present

## 2022-01-23 DIAGNOSIS — N186 End stage renal disease: Secondary | ICD-10-CM | POA: Diagnosis not present

## 2022-01-24 DIAGNOSIS — N186 End stage renal disease: Secondary | ICD-10-CM | POA: Diagnosis not present

## 2022-01-25 DIAGNOSIS — N186 End stage renal disease: Secondary | ICD-10-CM | POA: Diagnosis not present

## 2022-01-26 DIAGNOSIS — N186 End stage renal disease: Secondary | ICD-10-CM | POA: Diagnosis not present

## 2022-01-27 DIAGNOSIS — N186 End stage renal disease: Secondary | ICD-10-CM | POA: Diagnosis not present

## 2022-01-28 DIAGNOSIS — N186 End stage renal disease: Secondary | ICD-10-CM | POA: Diagnosis not present

## 2022-01-29 DIAGNOSIS — N186 End stage renal disease: Secondary | ICD-10-CM | POA: Diagnosis not present

## 2022-01-30 DIAGNOSIS — N186 End stage renal disease: Secondary | ICD-10-CM | POA: Diagnosis not present

## 2022-01-31 DIAGNOSIS — N186 End stage renal disease: Secondary | ICD-10-CM | POA: Diagnosis not present

## 2022-02-01 DIAGNOSIS — N186 End stage renal disease: Secondary | ICD-10-CM | POA: Diagnosis not present

## 2022-02-01 DIAGNOSIS — D631 Anemia in chronic kidney disease: Secondary | ICD-10-CM | POA: Diagnosis not present

## 2022-02-01 DIAGNOSIS — N2581 Secondary hyperparathyroidism of renal origin: Secondary | ICD-10-CM | POA: Diagnosis not present

## 2022-02-01 DIAGNOSIS — D509 Iron deficiency anemia, unspecified: Secondary | ICD-10-CM | POA: Diagnosis not present

## 2022-02-02 DIAGNOSIS — D509 Iron deficiency anemia, unspecified: Secondary | ICD-10-CM | POA: Diagnosis not present

## 2022-02-02 DIAGNOSIS — N186 End stage renal disease: Secondary | ICD-10-CM | POA: Diagnosis not present

## 2022-02-02 DIAGNOSIS — D631 Anemia in chronic kidney disease: Secondary | ICD-10-CM | POA: Diagnosis not present

## 2022-02-02 DIAGNOSIS — N2581 Secondary hyperparathyroidism of renal origin: Secondary | ICD-10-CM | POA: Diagnosis not present

## 2022-02-03 DIAGNOSIS — D631 Anemia in chronic kidney disease: Secondary | ICD-10-CM | POA: Diagnosis not present

## 2022-02-03 DIAGNOSIS — D509 Iron deficiency anemia, unspecified: Secondary | ICD-10-CM | POA: Diagnosis not present

## 2022-02-03 DIAGNOSIS — N186 End stage renal disease: Secondary | ICD-10-CM | POA: Diagnosis not present

## 2022-02-03 DIAGNOSIS — N2581 Secondary hyperparathyroidism of renal origin: Secondary | ICD-10-CM | POA: Diagnosis not present

## 2022-02-04 DIAGNOSIS — D631 Anemia in chronic kidney disease: Secondary | ICD-10-CM | POA: Diagnosis not present

## 2022-02-04 DIAGNOSIS — D509 Iron deficiency anemia, unspecified: Secondary | ICD-10-CM | POA: Diagnosis not present

## 2022-02-04 DIAGNOSIS — N186 End stage renal disease: Secondary | ICD-10-CM | POA: Diagnosis not present

## 2022-02-04 DIAGNOSIS — N2581 Secondary hyperparathyroidism of renal origin: Secondary | ICD-10-CM | POA: Diagnosis not present

## 2022-02-05 DIAGNOSIS — D631 Anemia in chronic kidney disease: Secondary | ICD-10-CM | POA: Diagnosis not present

## 2022-02-05 DIAGNOSIS — N2581 Secondary hyperparathyroidism of renal origin: Secondary | ICD-10-CM | POA: Diagnosis not present

## 2022-02-05 DIAGNOSIS — D509 Iron deficiency anemia, unspecified: Secondary | ICD-10-CM | POA: Diagnosis not present

## 2022-02-05 DIAGNOSIS — N186 End stage renal disease: Secondary | ICD-10-CM | POA: Diagnosis not present

## 2022-02-06 DIAGNOSIS — D509 Iron deficiency anemia, unspecified: Secondary | ICD-10-CM | POA: Diagnosis not present

## 2022-02-06 DIAGNOSIS — D631 Anemia in chronic kidney disease: Secondary | ICD-10-CM | POA: Diagnosis not present

## 2022-02-06 DIAGNOSIS — N186 End stage renal disease: Secondary | ICD-10-CM | POA: Diagnosis not present

## 2022-02-06 DIAGNOSIS — N2581 Secondary hyperparathyroidism of renal origin: Secondary | ICD-10-CM | POA: Diagnosis not present

## 2022-02-07 DIAGNOSIS — N186 End stage renal disease: Secondary | ICD-10-CM | POA: Diagnosis not present

## 2022-02-07 DIAGNOSIS — D509 Iron deficiency anemia, unspecified: Secondary | ICD-10-CM | POA: Diagnosis not present

## 2022-02-07 DIAGNOSIS — D631 Anemia in chronic kidney disease: Secondary | ICD-10-CM | POA: Diagnosis not present

## 2022-02-07 DIAGNOSIS — N2581 Secondary hyperparathyroidism of renal origin: Secondary | ICD-10-CM | POA: Diagnosis not present

## 2022-02-08 DIAGNOSIS — N2581 Secondary hyperparathyroidism of renal origin: Secondary | ICD-10-CM | POA: Diagnosis not present

## 2022-02-08 DIAGNOSIS — D631 Anemia in chronic kidney disease: Secondary | ICD-10-CM | POA: Diagnosis not present

## 2022-02-08 DIAGNOSIS — N186 End stage renal disease: Secondary | ICD-10-CM | POA: Diagnosis not present

## 2022-02-08 DIAGNOSIS — D509 Iron deficiency anemia, unspecified: Secondary | ICD-10-CM | POA: Diagnosis not present

## 2022-02-09 DIAGNOSIS — N186 End stage renal disease: Secondary | ICD-10-CM | POA: Diagnosis not present

## 2022-02-09 DIAGNOSIS — D509 Iron deficiency anemia, unspecified: Secondary | ICD-10-CM | POA: Diagnosis not present

## 2022-02-09 DIAGNOSIS — N2581 Secondary hyperparathyroidism of renal origin: Secondary | ICD-10-CM | POA: Diagnosis not present

## 2022-02-09 DIAGNOSIS — D631 Anemia in chronic kidney disease: Secondary | ICD-10-CM | POA: Diagnosis not present

## 2022-02-10 DIAGNOSIS — N2581 Secondary hyperparathyroidism of renal origin: Secondary | ICD-10-CM | POA: Diagnosis not present

## 2022-02-10 DIAGNOSIS — N186 End stage renal disease: Secondary | ICD-10-CM | POA: Diagnosis not present

## 2022-02-10 DIAGNOSIS — D509 Iron deficiency anemia, unspecified: Secondary | ICD-10-CM | POA: Diagnosis not present

## 2022-02-10 DIAGNOSIS — D631 Anemia in chronic kidney disease: Secondary | ICD-10-CM | POA: Diagnosis not present

## 2022-02-11 DIAGNOSIS — N186 End stage renal disease: Secondary | ICD-10-CM | POA: Diagnosis not present

## 2022-02-12 DIAGNOSIS — N186 End stage renal disease: Secondary | ICD-10-CM | POA: Diagnosis not present

## 2022-02-13 DIAGNOSIS — N186 End stage renal disease: Secondary | ICD-10-CM | POA: Diagnosis not present

## 2022-02-14 DIAGNOSIS — N186 End stage renal disease: Secondary | ICD-10-CM | POA: Diagnosis not present

## 2022-02-15 DIAGNOSIS — N186 End stage renal disease: Secondary | ICD-10-CM | POA: Diagnosis not present

## 2022-02-16 DIAGNOSIS — N186 End stage renal disease: Secondary | ICD-10-CM | POA: Diagnosis not present

## 2022-02-17 DIAGNOSIS — N186 End stage renal disease: Secondary | ICD-10-CM | POA: Diagnosis not present

## 2022-02-18 DIAGNOSIS — N186 End stage renal disease: Secondary | ICD-10-CM | POA: Diagnosis not present

## 2022-02-19 DIAGNOSIS — N186 End stage renal disease: Secondary | ICD-10-CM | POA: Diagnosis not present

## 2022-02-20 DIAGNOSIS — N186 End stage renal disease: Secondary | ICD-10-CM | POA: Diagnosis not present

## 2022-02-21 DIAGNOSIS — N186 End stage renal disease: Secondary | ICD-10-CM | POA: Diagnosis not present

## 2022-02-22 DIAGNOSIS — I10 Essential (primary) hypertension: Secondary | ICD-10-CM | POA: Diagnosis not present

## 2022-02-22 DIAGNOSIS — N186 End stage renal disease: Secondary | ICD-10-CM | POA: Diagnosis not present

## 2022-02-22 DIAGNOSIS — D631 Anemia in chronic kidney disease: Secondary | ICD-10-CM | POA: Diagnosis not present

## 2022-02-23 DIAGNOSIS — N186 End stage renal disease: Secondary | ICD-10-CM | POA: Diagnosis not present

## 2022-02-24 DIAGNOSIS — N186 End stage renal disease: Secondary | ICD-10-CM | POA: Diagnosis not present

## 2022-02-25 DIAGNOSIS — N186 End stage renal disease: Secondary | ICD-10-CM | POA: Diagnosis not present

## 2022-02-26 DIAGNOSIS — N186 End stage renal disease: Secondary | ICD-10-CM | POA: Diagnosis not present

## 2022-02-27 DIAGNOSIS — N186 End stage renal disease: Secondary | ICD-10-CM | POA: Diagnosis not present

## 2022-02-28 ENCOUNTER — Other Ambulatory Visit: Payer: Self-pay | Admitting: Internal Medicine

## 2022-02-28 DIAGNOSIS — N186 End stage renal disease: Secondary | ICD-10-CM | POA: Diagnosis not present

## 2022-02-28 DIAGNOSIS — K219 Gastro-esophageal reflux disease without esophagitis: Secondary | ICD-10-CM

## 2022-02-28 DIAGNOSIS — I10 Essential (primary) hypertension: Secondary | ICD-10-CM | POA: Diagnosis not present

## 2022-02-28 DIAGNOSIS — Z992 Dependence on renal dialysis: Secondary | ICD-10-CM | POA: Diagnosis not present

## 2022-02-28 DIAGNOSIS — R1310 Dysphagia, unspecified: Secondary | ICD-10-CM | POA: Diagnosis not present

## 2022-03-01 DIAGNOSIS — N186 End stage renal disease: Secondary | ICD-10-CM | POA: Diagnosis not present

## 2022-03-02 DIAGNOSIS — N186 End stage renal disease: Secondary | ICD-10-CM | POA: Diagnosis not present

## 2022-03-03 ENCOUNTER — Ambulatory Visit
Admission: RE | Admit: 2022-03-03 | Discharge: 2022-03-03 | Disposition: A | Discharge status: Home / Self Care | Payer: BC Managed Care – PPO | Source: Ambulatory Visit | Attending: Internal Medicine | Admitting: Internal Medicine

## 2022-03-03 DIAGNOSIS — R1310 Dysphagia, unspecified: Secondary | ICD-10-CM

## 2022-03-03 DIAGNOSIS — K219 Gastro-esophageal reflux disease without esophagitis: Secondary | ICD-10-CM

## 2022-03-03 DIAGNOSIS — N186 End stage renal disease: Secondary | ICD-10-CM | POA: Diagnosis not present

## 2022-03-03 DIAGNOSIS — K449 Diaphragmatic hernia without obstruction or gangrene: Secondary | ICD-10-CM | POA: Diagnosis not present

## 2022-03-04 DIAGNOSIS — Z23 Encounter for immunization: Secondary | ICD-10-CM | POA: Diagnosis not present

## 2022-03-04 DIAGNOSIS — D509 Iron deficiency anemia, unspecified: Secondary | ICD-10-CM | POA: Diagnosis not present

## 2022-03-04 DIAGNOSIS — D631 Anemia in chronic kidney disease: Secondary | ICD-10-CM | POA: Diagnosis not present

## 2022-03-04 DIAGNOSIS — N186 End stage renal disease: Secondary | ICD-10-CM | POA: Diagnosis not present

## 2022-03-04 DIAGNOSIS — N2581 Secondary hyperparathyroidism of renal origin: Secondary | ICD-10-CM | POA: Diagnosis not present

## 2022-03-05 DIAGNOSIS — Z23 Encounter for immunization: Secondary | ICD-10-CM | POA: Diagnosis not present

## 2022-03-05 DIAGNOSIS — D631 Anemia in chronic kidney disease: Secondary | ICD-10-CM | POA: Diagnosis not present

## 2022-03-05 DIAGNOSIS — N2581 Secondary hyperparathyroidism of renal origin: Secondary | ICD-10-CM | POA: Diagnosis not present

## 2022-03-05 DIAGNOSIS — N186 End stage renal disease: Secondary | ICD-10-CM | POA: Diagnosis not present

## 2022-03-05 DIAGNOSIS — D509 Iron deficiency anemia, unspecified: Secondary | ICD-10-CM | POA: Diagnosis not present

## 2022-03-06 DIAGNOSIS — D631 Anemia in chronic kidney disease: Secondary | ICD-10-CM | POA: Diagnosis not present

## 2022-03-06 DIAGNOSIS — N186 End stage renal disease: Secondary | ICD-10-CM | POA: Diagnosis not present

## 2022-03-06 DIAGNOSIS — D509 Iron deficiency anemia, unspecified: Secondary | ICD-10-CM | POA: Diagnosis not present

## 2022-03-06 DIAGNOSIS — Z23 Encounter for immunization: Secondary | ICD-10-CM | POA: Diagnosis not present

## 2022-03-06 DIAGNOSIS — N2581 Secondary hyperparathyroidism of renal origin: Secondary | ICD-10-CM | POA: Diagnosis not present

## 2022-03-07 DIAGNOSIS — N186 End stage renal disease: Secondary | ICD-10-CM | POA: Diagnosis not present

## 2022-03-07 DIAGNOSIS — D631 Anemia in chronic kidney disease: Secondary | ICD-10-CM | POA: Diagnosis not present

## 2022-03-07 DIAGNOSIS — N2581 Secondary hyperparathyroidism of renal origin: Secondary | ICD-10-CM | POA: Diagnosis not present

## 2022-03-07 DIAGNOSIS — D509 Iron deficiency anemia, unspecified: Secondary | ICD-10-CM | POA: Diagnosis not present

## 2022-03-07 DIAGNOSIS — Z23 Encounter for immunization: Secondary | ICD-10-CM | POA: Diagnosis not present

## 2022-03-08 DIAGNOSIS — D509 Iron deficiency anemia, unspecified: Secondary | ICD-10-CM | POA: Diagnosis not present

## 2022-03-08 DIAGNOSIS — N186 End stage renal disease: Secondary | ICD-10-CM | POA: Diagnosis not present

## 2022-03-08 DIAGNOSIS — N2581 Secondary hyperparathyroidism of renal origin: Secondary | ICD-10-CM | POA: Diagnosis not present

## 2022-03-08 DIAGNOSIS — Z23 Encounter for immunization: Secondary | ICD-10-CM | POA: Diagnosis not present

## 2022-03-08 DIAGNOSIS — D631 Anemia in chronic kidney disease: Secondary | ICD-10-CM | POA: Diagnosis not present

## 2022-03-09 DIAGNOSIS — N2581 Secondary hyperparathyroidism of renal origin: Secondary | ICD-10-CM | POA: Diagnosis not present

## 2022-03-09 DIAGNOSIS — D509 Iron deficiency anemia, unspecified: Secondary | ICD-10-CM | POA: Diagnosis not present

## 2022-03-09 DIAGNOSIS — N186 End stage renal disease: Secondary | ICD-10-CM | POA: Diagnosis not present

## 2022-03-09 DIAGNOSIS — Z23 Encounter for immunization: Secondary | ICD-10-CM | POA: Diagnosis not present

## 2022-03-09 DIAGNOSIS — D631 Anemia in chronic kidney disease: Secondary | ICD-10-CM | POA: Diagnosis not present

## 2022-03-10 DIAGNOSIS — N186 End stage renal disease: Secondary | ICD-10-CM | POA: Diagnosis not present

## 2022-03-10 DIAGNOSIS — D509 Iron deficiency anemia, unspecified: Secondary | ICD-10-CM | POA: Diagnosis not present

## 2022-03-10 DIAGNOSIS — D631 Anemia in chronic kidney disease: Secondary | ICD-10-CM | POA: Diagnosis not present

## 2022-03-10 DIAGNOSIS — Z23 Encounter for immunization: Secondary | ICD-10-CM | POA: Diagnosis not present

## 2022-03-10 DIAGNOSIS — N2581 Secondary hyperparathyroidism of renal origin: Secondary | ICD-10-CM | POA: Diagnosis not present

## 2022-03-11 DIAGNOSIS — N186 End stage renal disease: Secondary | ICD-10-CM | POA: Diagnosis not present

## 2022-03-11 DIAGNOSIS — D509 Iron deficiency anemia, unspecified: Secondary | ICD-10-CM | POA: Diagnosis not present

## 2022-03-11 DIAGNOSIS — N2581 Secondary hyperparathyroidism of renal origin: Secondary | ICD-10-CM | POA: Diagnosis not present

## 2022-03-11 DIAGNOSIS — D631 Anemia in chronic kidney disease: Secondary | ICD-10-CM | POA: Diagnosis not present

## 2022-03-11 DIAGNOSIS — Z23 Encounter for immunization: Secondary | ICD-10-CM | POA: Diagnosis not present

## 2022-03-12 DIAGNOSIS — N2581 Secondary hyperparathyroidism of renal origin: Secondary | ICD-10-CM | POA: Diagnosis not present

## 2022-03-12 DIAGNOSIS — D509 Iron deficiency anemia, unspecified: Secondary | ICD-10-CM | POA: Diagnosis not present

## 2022-03-12 DIAGNOSIS — D631 Anemia in chronic kidney disease: Secondary | ICD-10-CM | POA: Diagnosis not present

## 2022-03-12 DIAGNOSIS — Z23 Encounter for immunization: Secondary | ICD-10-CM | POA: Diagnosis not present

## 2022-03-12 DIAGNOSIS — N186 End stage renal disease: Secondary | ICD-10-CM | POA: Diagnosis not present

## 2022-03-13 DIAGNOSIS — N186 End stage renal disease: Secondary | ICD-10-CM | POA: Diagnosis not present

## 2022-03-13 DIAGNOSIS — D631 Anemia in chronic kidney disease: Secondary | ICD-10-CM | POA: Diagnosis not present

## 2022-03-13 DIAGNOSIS — N2581 Secondary hyperparathyroidism of renal origin: Secondary | ICD-10-CM | POA: Diagnosis not present

## 2022-03-13 DIAGNOSIS — Z23 Encounter for immunization: Secondary | ICD-10-CM | POA: Diagnosis not present

## 2022-03-13 DIAGNOSIS — D509 Iron deficiency anemia, unspecified: Secondary | ICD-10-CM | POA: Diagnosis not present

## 2022-03-14 DIAGNOSIS — N186 End stage renal disease: Secondary | ICD-10-CM | POA: Diagnosis not present

## 2022-03-15 DIAGNOSIS — N186 End stage renal disease: Secondary | ICD-10-CM | POA: Diagnosis not present

## 2022-03-16 DIAGNOSIS — N186 End stage renal disease: Secondary | ICD-10-CM | POA: Diagnosis not present

## 2022-03-17 DIAGNOSIS — N186 End stage renal disease: Secondary | ICD-10-CM | POA: Diagnosis not present

## 2022-03-18 DIAGNOSIS — N186 End stage renal disease: Secondary | ICD-10-CM | POA: Diagnosis not present

## 2022-03-19 DIAGNOSIS — N186 End stage renal disease: Secondary | ICD-10-CM | POA: Diagnosis not present

## 2022-03-20 DIAGNOSIS — N186 End stage renal disease: Secondary | ICD-10-CM | POA: Diagnosis not present

## 2022-03-21 DIAGNOSIS — N186 End stage renal disease: Secondary | ICD-10-CM | POA: Diagnosis not present

## 2022-03-22 DIAGNOSIS — N186 End stage renal disease: Secondary | ICD-10-CM | POA: Diagnosis not present

## 2022-03-23 DIAGNOSIS — N186 End stage renal disease: Secondary | ICD-10-CM | POA: Diagnosis not present

## 2022-03-24 DIAGNOSIS — I10 Essential (primary) hypertension: Secondary | ICD-10-CM | POA: Diagnosis not present

## 2022-03-24 DIAGNOSIS — N2581 Secondary hyperparathyroidism of renal origin: Secondary | ICD-10-CM | POA: Diagnosis not present

## 2022-03-24 DIAGNOSIS — D631 Anemia in chronic kidney disease: Secondary | ICD-10-CM | POA: Diagnosis not present

## 2022-03-24 DIAGNOSIS — N186 End stage renal disease: Secondary | ICD-10-CM | POA: Diagnosis not present

## 2022-03-24 DIAGNOSIS — D509 Iron deficiency anemia, unspecified: Secondary | ICD-10-CM | POA: Diagnosis not present

## 2022-03-25 DIAGNOSIS — D509 Iron deficiency anemia, unspecified: Secondary | ICD-10-CM | POA: Diagnosis not present

## 2022-03-25 DIAGNOSIS — N2581 Secondary hyperparathyroidism of renal origin: Secondary | ICD-10-CM | POA: Diagnosis not present

## 2022-03-25 DIAGNOSIS — N186 End stage renal disease: Secondary | ICD-10-CM | POA: Diagnosis not present

## 2022-03-25 DIAGNOSIS — D631 Anemia in chronic kidney disease: Secondary | ICD-10-CM | POA: Diagnosis not present

## 2022-03-26 DIAGNOSIS — N2581 Secondary hyperparathyroidism of renal origin: Secondary | ICD-10-CM | POA: Diagnosis not present

## 2022-03-26 DIAGNOSIS — N186 End stage renal disease: Secondary | ICD-10-CM | POA: Diagnosis not present

## 2022-03-26 DIAGNOSIS — D631 Anemia in chronic kidney disease: Secondary | ICD-10-CM | POA: Diagnosis not present

## 2022-03-26 DIAGNOSIS — D509 Iron deficiency anemia, unspecified: Secondary | ICD-10-CM | POA: Diagnosis not present

## 2022-03-27 DIAGNOSIS — D631 Anemia in chronic kidney disease: Secondary | ICD-10-CM | POA: Diagnosis not present

## 2022-03-27 DIAGNOSIS — N186 End stage renal disease: Secondary | ICD-10-CM | POA: Diagnosis not present

## 2022-03-27 DIAGNOSIS — D509 Iron deficiency anemia, unspecified: Secondary | ICD-10-CM | POA: Diagnosis not present

## 2022-03-27 DIAGNOSIS — N2581 Secondary hyperparathyroidism of renal origin: Secondary | ICD-10-CM | POA: Diagnosis not present

## 2022-03-28 DIAGNOSIS — D631 Anemia in chronic kidney disease: Secondary | ICD-10-CM | POA: Diagnosis not present

## 2022-03-28 DIAGNOSIS — D509 Iron deficiency anemia, unspecified: Secondary | ICD-10-CM | POA: Diagnosis not present

## 2022-03-28 DIAGNOSIS — N186 End stage renal disease: Secondary | ICD-10-CM | POA: Diagnosis not present

## 2022-03-28 DIAGNOSIS — N2581 Secondary hyperparathyroidism of renal origin: Secondary | ICD-10-CM | POA: Diagnosis not present

## 2022-03-29 DIAGNOSIS — D509 Iron deficiency anemia, unspecified: Secondary | ICD-10-CM | POA: Diagnosis not present

## 2022-03-29 DIAGNOSIS — N2581 Secondary hyperparathyroidism of renal origin: Secondary | ICD-10-CM | POA: Diagnosis not present

## 2022-03-29 DIAGNOSIS — D631 Anemia in chronic kidney disease: Secondary | ICD-10-CM | POA: Diagnosis not present

## 2022-03-29 DIAGNOSIS — N186 End stage renal disease: Secondary | ICD-10-CM | POA: Diagnosis not present

## 2022-03-30 DIAGNOSIS — D631 Anemia in chronic kidney disease: Secondary | ICD-10-CM | POA: Diagnosis not present

## 2022-03-30 DIAGNOSIS — N186 End stage renal disease: Secondary | ICD-10-CM | POA: Diagnosis not present

## 2022-03-30 DIAGNOSIS — N2581 Secondary hyperparathyroidism of renal origin: Secondary | ICD-10-CM | POA: Diagnosis not present

## 2022-03-30 DIAGNOSIS — D509 Iron deficiency anemia, unspecified: Secondary | ICD-10-CM | POA: Diagnosis not present

## 2022-03-31 DIAGNOSIS — D509 Iron deficiency anemia, unspecified: Secondary | ICD-10-CM | POA: Diagnosis not present

## 2022-03-31 DIAGNOSIS — N186 End stage renal disease: Secondary | ICD-10-CM | POA: Diagnosis not present

## 2022-03-31 DIAGNOSIS — D631 Anemia in chronic kidney disease: Secondary | ICD-10-CM | POA: Diagnosis not present

## 2022-03-31 DIAGNOSIS — N2581 Secondary hyperparathyroidism of renal origin: Secondary | ICD-10-CM | POA: Diagnosis not present

## 2022-04-01 DIAGNOSIS — D631 Anemia in chronic kidney disease: Secondary | ICD-10-CM | POA: Diagnosis not present

## 2022-04-01 DIAGNOSIS — D509 Iron deficiency anemia, unspecified: Secondary | ICD-10-CM | POA: Diagnosis not present

## 2022-04-01 DIAGNOSIS — N2581 Secondary hyperparathyroidism of renal origin: Secondary | ICD-10-CM | POA: Diagnosis not present

## 2022-04-01 DIAGNOSIS — N186 End stage renal disease: Secondary | ICD-10-CM | POA: Diagnosis not present

## 2022-04-02 DIAGNOSIS — D509 Iron deficiency anemia, unspecified: Secondary | ICD-10-CM | POA: Diagnosis not present

## 2022-04-02 DIAGNOSIS — N186 End stage renal disease: Secondary | ICD-10-CM | POA: Diagnosis not present

## 2022-04-02 DIAGNOSIS — D631 Anemia in chronic kidney disease: Secondary | ICD-10-CM | POA: Diagnosis not present

## 2022-04-02 DIAGNOSIS — N2581 Secondary hyperparathyroidism of renal origin: Secondary | ICD-10-CM | POA: Diagnosis not present

## 2022-04-03 DIAGNOSIS — N186 End stage renal disease: Secondary | ICD-10-CM | POA: Diagnosis not present

## 2022-04-04 DIAGNOSIS — N186 End stage renal disease: Secondary | ICD-10-CM | POA: Diagnosis not present

## 2022-04-05 DIAGNOSIS — N186 End stage renal disease: Secondary | ICD-10-CM | POA: Diagnosis not present

## 2022-04-06 DIAGNOSIS — N186 End stage renal disease: Secondary | ICD-10-CM | POA: Diagnosis not present

## 2022-04-07 DIAGNOSIS — N186 End stage renal disease: Secondary | ICD-10-CM | POA: Diagnosis not present

## 2022-04-08 DIAGNOSIS — N186 End stage renal disease: Secondary | ICD-10-CM | POA: Diagnosis not present

## 2022-04-09 DIAGNOSIS — N186 End stage renal disease: Secondary | ICD-10-CM | POA: Diagnosis not present

## 2022-04-10 DIAGNOSIS — N186 End stage renal disease: Secondary | ICD-10-CM | POA: Diagnosis not present

## 2022-04-11 DIAGNOSIS — N186 End stage renal disease: Secondary | ICD-10-CM | POA: Diagnosis not present

## 2022-04-12 DIAGNOSIS — N2889 Other specified disorders of kidney and ureter: Secondary | ICD-10-CM | POA: Diagnosis not present

## 2022-04-12 DIAGNOSIS — N186 End stage renal disease: Secondary | ICD-10-CM | POA: Diagnosis not present

## 2022-04-13 DIAGNOSIS — N186 End stage renal disease: Secondary | ICD-10-CM | POA: Diagnosis not present

## 2022-04-14 DIAGNOSIS — N186 End stage renal disease: Secondary | ICD-10-CM | POA: Diagnosis not present

## 2022-04-15 DIAGNOSIS — N186 End stage renal disease: Secondary | ICD-10-CM | POA: Diagnosis not present

## 2022-04-16 DIAGNOSIS — N186 End stage renal disease: Secondary | ICD-10-CM | POA: Diagnosis not present

## 2022-04-17 DIAGNOSIS — N186 End stage renal disease: Secondary | ICD-10-CM | POA: Diagnosis not present

## 2022-04-18 DIAGNOSIS — N186 End stage renal disease: Secondary | ICD-10-CM | POA: Diagnosis not present

## 2022-04-19 DIAGNOSIS — N186 End stage renal disease: Secondary | ICD-10-CM | POA: Diagnosis not present

## 2022-04-20 DIAGNOSIS — N186 End stage renal disease: Secondary | ICD-10-CM | POA: Diagnosis not present

## 2022-04-21 DIAGNOSIS — N186 End stage renal disease: Secondary | ICD-10-CM | POA: Diagnosis not present

## 2022-04-22 DIAGNOSIS — N186 End stage renal disease: Secondary | ICD-10-CM | POA: Diagnosis not present

## 2022-04-23 DIAGNOSIS — N186 End stage renal disease: Secondary | ICD-10-CM | POA: Diagnosis not present

## 2022-04-24 DIAGNOSIS — N186 End stage renal disease: Secondary | ICD-10-CM | POA: Diagnosis not present

## 2022-04-25 DIAGNOSIS — N186 End stage renal disease: Secondary | ICD-10-CM | POA: Diagnosis not present

## 2022-04-26 DIAGNOSIS — N186 End stage renal disease: Secondary | ICD-10-CM | POA: Diagnosis not present

## 2022-04-27 DIAGNOSIS — N186 End stage renal disease: Secondary | ICD-10-CM | POA: Diagnosis not present

## 2022-04-28 DIAGNOSIS — N186 End stage renal disease: Secondary | ICD-10-CM | POA: Diagnosis not present

## 2022-04-29 DIAGNOSIS — N186 End stage renal disease: Secondary | ICD-10-CM | POA: Diagnosis not present

## 2022-04-30 DIAGNOSIS — N186 End stage renal disease: Secondary | ICD-10-CM | POA: Diagnosis not present

## 2022-05-01 DIAGNOSIS — N186 End stage renal disease: Secondary | ICD-10-CM | POA: Diagnosis not present

## 2022-05-02 DIAGNOSIS — N186 End stage renal disease: Secondary | ICD-10-CM | POA: Diagnosis not present

## 2022-05-03 DIAGNOSIS — N186 End stage renal disease: Secondary | ICD-10-CM | POA: Diagnosis not present

## 2022-05-04 DIAGNOSIS — D631 Anemia in chronic kidney disease: Secondary | ICD-10-CM | POA: Diagnosis not present

## 2022-05-04 DIAGNOSIS — N2581 Secondary hyperparathyroidism of renal origin: Secondary | ICD-10-CM | POA: Diagnosis not present

## 2022-05-04 DIAGNOSIS — N186 End stage renal disease: Secondary | ICD-10-CM | POA: Diagnosis not present

## 2022-05-04 DIAGNOSIS — D509 Iron deficiency anemia, unspecified: Secondary | ICD-10-CM | POA: Diagnosis not present

## 2022-05-05 DIAGNOSIS — N2581 Secondary hyperparathyroidism of renal origin: Secondary | ICD-10-CM | POA: Diagnosis not present

## 2022-05-05 DIAGNOSIS — D631 Anemia in chronic kidney disease: Secondary | ICD-10-CM | POA: Diagnosis not present

## 2022-05-05 DIAGNOSIS — N186 End stage renal disease: Secondary | ICD-10-CM | POA: Diagnosis not present

## 2022-05-05 DIAGNOSIS — D509 Iron deficiency anemia, unspecified: Secondary | ICD-10-CM | POA: Diagnosis not present

## 2022-05-07 DIAGNOSIS — N2581 Secondary hyperparathyroidism of renal origin: Secondary | ICD-10-CM | POA: Diagnosis not present

## 2022-05-07 DIAGNOSIS — D509 Iron deficiency anemia, unspecified: Secondary | ICD-10-CM | POA: Diagnosis not present

## 2022-05-07 DIAGNOSIS — D631 Anemia in chronic kidney disease: Secondary | ICD-10-CM | POA: Diagnosis not present

## 2022-05-07 DIAGNOSIS — N186 End stage renal disease: Secondary | ICD-10-CM | POA: Diagnosis not present

## 2022-05-08 DIAGNOSIS — D631 Anemia in chronic kidney disease: Secondary | ICD-10-CM | POA: Diagnosis not present

## 2022-05-08 DIAGNOSIS — D509 Iron deficiency anemia, unspecified: Secondary | ICD-10-CM | POA: Diagnosis not present

## 2022-05-08 DIAGNOSIS — N186 End stage renal disease: Secondary | ICD-10-CM | POA: Diagnosis not present

## 2022-05-08 DIAGNOSIS — N2581 Secondary hyperparathyroidism of renal origin: Secondary | ICD-10-CM | POA: Diagnosis not present

## 2022-05-09 DIAGNOSIS — D631 Anemia in chronic kidney disease: Secondary | ICD-10-CM | POA: Diagnosis not present

## 2022-05-09 DIAGNOSIS — D509 Iron deficiency anemia, unspecified: Secondary | ICD-10-CM | POA: Diagnosis not present

## 2022-05-09 DIAGNOSIS — N186 End stage renal disease: Secondary | ICD-10-CM | POA: Diagnosis not present

## 2022-05-09 DIAGNOSIS — N2581 Secondary hyperparathyroidism of renal origin: Secondary | ICD-10-CM | POA: Diagnosis not present

## 2022-05-10 DIAGNOSIS — N186 End stage renal disease: Secondary | ICD-10-CM | POA: Diagnosis not present

## 2022-05-10 DIAGNOSIS — N2581 Secondary hyperparathyroidism of renal origin: Secondary | ICD-10-CM | POA: Diagnosis not present

## 2022-05-10 DIAGNOSIS — D509 Iron deficiency anemia, unspecified: Secondary | ICD-10-CM | POA: Diagnosis not present

## 2022-05-10 DIAGNOSIS — D631 Anemia in chronic kidney disease: Secondary | ICD-10-CM | POA: Diagnosis not present

## 2022-05-11 DIAGNOSIS — N186 End stage renal disease: Secondary | ICD-10-CM | POA: Diagnosis not present

## 2022-05-11 DIAGNOSIS — N2581 Secondary hyperparathyroidism of renal origin: Secondary | ICD-10-CM | POA: Diagnosis not present

## 2022-05-11 DIAGNOSIS — D631 Anemia in chronic kidney disease: Secondary | ICD-10-CM | POA: Diagnosis not present

## 2022-05-11 DIAGNOSIS — D509 Iron deficiency anemia, unspecified: Secondary | ICD-10-CM | POA: Diagnosis not present

## 2022-05-12 DIAGNOSIS — N2581 Secondary hyperparathyroidism of renal origin: Secondary | ICD-10-CM | POA: Diagnosis not present

## 2022-05-12 DIAGNOSIS — D509 Iron deficiency anemia, unspecified: Secondary | ICD-10-CM | POA: Diagnosis not present

## 2022-05-12 DIAGNOSIS — N186 End stage renal disease: Secondary | ICD-10-CM | POA: Diagnosis not present

## 2022-05-12 DIAGNOSIS — D631 Anemia in chronic kidney disease: Secondary | ICD-10-CM | POA: Diagnosis not present

## 2022-05-13 DIAGNOSIS — N186 End stage renal disease: Secondary | ICD-10-CM | POA: Diagnosis not present

## 2022-05-13 DIAGNOSIS — D509 Iron deficiency anemia, unspecified: Secondary | ICD-10-CM | POA: Diagnosis not present

## 2022-05-13 DIAGNOSIS — D631 Anemia in chronic kidney disease: Secondary | ICD-10-CM | POA: Diagnosis not present

## 2022-05-13 DIAGNOSIS — N2581 Secondary hyperparathyroidism of renal origin: Secondary | ICD-10-CM | POA: Diagnosis not present

## 2022-05-14 DIAGNOSIS — N186 End stage renal disease: Secondary | ICD-10-CM | POA: Diagnosis not present

## 2022-05-15 DIAGNOSIS — N186 End stage renal disease: Secondary | ICD-10-CM | POA: Diagnosis not present

## 2022-05-16 DIAGNOSIS — N186 End stage renal disease: Secondary | ICD-10-CM | POA: Diagnosis not present

## 2022-05-17 DIAGNOSIS — N186 End stage renal disease: Secondary | ICD-10-CM | POA: Diagnosis not present

## 2022-05-19 DIAGNOSIS — N186 End stage renal disease: Secondary | ICD-10-CM | POA: Diagnosis not present

## 2022-05-20 DIAGNOSIS — N186 End stage renal disease: Secondary | ICD-10-CM | POA: Diagnosis not present

## 2022-05-21 DIAGNOSIS — N186 End stage renal disease: Secondary | ICD-10-CM | POA: Diagnosis not present

## 2022-05-22 DIAGNOSIS — N186 End stage renal disease: Secondary | ICD-10-CM | POA: Diagnosis not present

## 2022-05-23 DIAGNOSIS — N186 End stage renal disease: Secondary | ICD-10-CM | POA: Diagnosis not present

## 2022-05-25 DIAGNOSIS — N186 End stage renal disease: Secondary | ICD-10-CM | POA: Diagnosis not present

## 2022-05-25 DIAGNOSIS — N2581 Secondary hyperparathyroidism of renal origin: Secondary | ICD-10-CM | POA: Diagnosis not present

## 2022-05-25 DIAGNOSIS — I10 Essential (primary) hypertension: Secondary | ICD-10-CM | POA: Diagnosis not present

## 2022-05-25 DIAGNOSIS — D631 Anemia in chronic kidney disease: Secondary | ICD-10-CM | POA: Diagnosis not present

## 2022-05-26 DIAGNOSIS — D631 Anemia in chronic kidney disease: Secondary | ICD-10-CM | POA: Diagnosis not present

## 2022-05-26 DIAGNOSIS — N186 End stage renal disease: Secondary | ICD-10-CM | POA: Diagnosis not present

## 2022-05-26 DIAGNOSIS — N2581 Secondary hyperparathyroidism of renal origin: Secondary | ICD-10-CM | POA: Diagnosis not present

## 2022-05-27 DIAGNOSIS — N2581 Secondary hyperparathyroidism of renal origin: Secondary | ICD-10-CM | POA: Diagnosis not present

## 2022-05-27 DIAGNOSIS — D631 Anemia in chronic kidney disease: Secondary | ICD-10-CM | POA: Diagnosis not present

## 2022-05-27 DIAGNOSIS — N186 End stage renal disease: Secondary | ICD-10-CM | POA: Diagnosis not present

## 2022-05-28 DIAGNOSIS — D631 Anemia in chronic kidney disease: Secondary | ICD-10-CM | POA: Diagnosis not present

## 2022-05-28 DIAGNOSIS — N2581 Secondary hyperparathyroidism of renal origin: Secondary | ICD-10-CM | POA: Diagnosis not present

## 2022-05-28 DIAGNOSIS — N186 End stage renal disease: Secondary | ICD-10-CM | POA: Diagnosis not present

## 2022-05-29 DIAGNOSIS — N2581 Secondary hyperparathyroidism of renal origin: Secondary | ICD-10-CM | POA: Diagnosis not present

## 2022-05-29 DIAGNOSIS — D631 Anemia in chronic kidney disease: Secondary | ICD-10-CM | POA: Diagnosis not present

## 2022-05-29 DIAGNOSIS — N186 End stage renal disease: Secondary | ICD-10-CM | POA: Diagnosis not present

## 2022-05-30 DIAGNOSIS — R0602 Shortness of breath: Secondary | ICD-10-CM | POA: Diagnosis not present

## 2022-05-30 DIAGNOSIS — N2581 Secondary hyperparathyroidism of renal origin: Secondary | ICD-10-CM | POA: Diagnosis not present

## 2022-05-30 DIAGNOSIS — R5383 Other fatigue: Secondary | ICD-10-CM | POA: Diagnosis not present

## 2022-05-30 DIAGNOSIS — I10 Essential (primary) hypertension: Secondary | ICD-10-CM | POA: Diagnosis not present

## 2022-05-30 DIAGNOSIS — D631 Anemia in chronic kidney disease: Secondary | ICD-10-CM | POA: Diagnosis not present

## 2022-05-30 DIAGNOSIS — R112 Nausea with vomiting, unspecified: Secondary | ICD-10-CM | POA: Diagnosis not present

## 2022-05-30 DIAGNOSIS — N186 End stage renal disease: Secondary | ICD-10-CM | POA: Diagnosis not present

## 2022-05-31 DIAGNOSIS — K219 Gastro-esophageal reflux disease without esophagitis: Secondary | ICD-10-CM | POA: Diagnosis not present

## 2022-05-31 DIAGNOSIS — E8770 Fluid overload, unspecified: Secondary | ICD-10-CM | POA: Diagnosis not present

## 2022-05-31 DIAGNOSIS — J810 Acute pulmonary edema: Secondary | ICD-10-CM | POA: Diagnosis not present

## 2022-05-31 DIAGNOSIS — I120 Hypertensive chronic kidney disease with stage 5 chronic kidney disease or end stage renal disease: Secondary | ICD-10-CM | POA: Diagnosis not present

## 2022-05-31 DIAGNOSIS — Z90710 Acquired absence of both cervix and uterus: Secondary | ICD-10-CM | POA: Diagnosis not present

## 2022-05-31 DIAGNOSIS — D631 Anemia in chronic kidney disease: Secondary | ICD-10-CM | POA: Diagnosis not present

## 2022-05-31 DIAGNOSIS — N186 End stage renal disease: Secondary | ICD-10-CM | POA: Diagnosis not present

## 2022-05-31 DIAGNOSIS — M109 Gout, unspecified: Secondary | ICD-10-CM | POA: Diagnosis not present

## 2022-05-31 DIAGNOSIS — D649 Anemia, unspecified: Secondary | ICD-10-CM | POA: Diagnosis not present

## 2022-05-31 DIAGNOSIS — I10 Essential (primary) hypertension: Secondary | ICD-10-CM | POA: Diagnosis not present

## 2022-05-31 DIAGNOSIS — I517 Cardiomegaly: Secondary | ICD-10-CM | POA: Diagnosis not present

## 2022-05-31 DIAGNOSIS — Z8673 Personal history of transient ischemic attack (TIA), and cerebral infarction without residual deficits: Secondary | ICD-10-CM | POA: Diagnosis not present

## 2022-05-31 DIAGNOSIS — N2581 Secondary hyperparathyroidism of renal origin: Secondary | ICD-10-CM | POA: Diagnosis not present

## 2022-05-31 DIAGNOSIS — Z79899 Other long term (current) drug therapy: Secondary | ICD-10-CM | POA: Diagnosis not present

## 2022-05-31 DIAGNOSIS — Z20822 Contact with and (suspected) exposure to covid-19: Secondary | ICD-10-CM | POA: Diagnosis not present

## 2022-05-31 DIAGNOSIS — R000 Tachycardia, unspecified: Secondary | ICD-10-CM | POA: Diagnosis not present

## 2022-05-31 DIAGNOSIS — Z992 Dependence on renal dialysis: Secondary | ICD-10-CM | POA: Diagnosis not present

## 2022-05-31 DIAGNOSIS — R0602 Shortness of breath: Secondary | ICD-10-CM | POA: Diagnosis not present

## 2022-06-01 DIAGNOSIS — N2581 Secondary hyperparathyroidism of renal origin: Secondary | ICD-10-CM | POA: Diagnosis not present

## 2022-06-01 DIAGNOSIS — N186 End stage renal disease: Secondary | ICD-10-CM | POA: Diagnosis not present

## 2022-06-01 DIAGNOSIS — D631 Anemia in chronic kidney disease: Secondary | ICD-10-CM | POA: Diagnosis not present

## 2022-06-02 DIAGNOSIS — N2581 Secondary hyperparathyroidism of renal origin: Secondary | ICD-10-CM | POA: Diagnosis not present

## 2022-06-02 DIAGNOSIS — D631 Anemia in chronic kidney disease: Secondary | ICD-10-CM | POA: Diagnosis not present

## 2022-06-02 DIAGNOSIS — N186 End stage renal disease: Secondary | ICD-10-CM | POA: Diagnosis not present

## 2022-06-03 DIAGNOSIS — D631 Anemia in chronic kidney disease: Secondary | ICD-10-CM | POA: Diagnosis not present

## 2022-06-03 DIAGNOSIS — N2581 Secondary hyperparathyroidism of renal origin: Secondary | ICD-10-CM | POA: Diagnosis not present

## 2022-06-03 DIAGNOSIS — N186 End stage renal disease: Secondary | ICD-10-CM | POA: Diagnosis not present

## 2022-06-04 DIAGNOSIS — N186 End stage renal disease: Secondary | ICD-10-CM | POA: Diagnosis not present

## 2022-06-05 DIAGNOSIS — N186 End stage renal disease: Secondary | ICD-10-CM | POA: Diagnosis not present

## 2022-06-06 DIAGNOSIS — N186 End stage renal disease: Secondary | ICD-10-CM | POA: Diagnosis not present

## 2022-06-07 DIAGNOSIS — N186 End stage renal disease: Secondary | ICD-10-CM | POA: Diagnosis not present

## 2022-06-08 DIAGNOSIS — N186 End stage renal disease: Secondary | ICD-10-CM | POA: Diagnosis not present

## 2022-06-09 DIAGNOSIS — N186 End stage renal disease: Secondary | ICD-10-CM | POA: Diagnosis not present

## 2022-06-10 DIAGNOSIS — N186 End stage renal disease: Secondary | ICD-10-CM | POA: Diagnosis not present

## 2022-06-11 DIAGNOSIS — N186 End stage renal disease: Secondary | ICD-10-CM | POA: Diagnosis not present

## 2022-06-12 DIAGNOSIS — N186 End stage renal disease: Secondary | ICD-10-CM | POA: Diagnosis not present

## 2022-06-13 DIAGNOSIS — N186 End stage renal disease: Secondary | ICD-10-CM | POA: Diagnosis not present

## 2022-06-14 DIAGNOSIS — N186 End stage renal disease: Secondary | ICD-10-CM | POA: Diagnosis not present

## 2022-06-15 DIAGNOSIS — Z01818 Encounter for other preprocedural examination: Secondary | ICD-10-CM | POA: Diagnosis not present

## 2022-06-15 DIAGNOSIS — N186 End stage renal disease: Secondary | ICD-10-CM | POA: Diagnosis not present

## 2022-06-16 DIAGNOSIS — N186 End stage renal disease: Secondary | ICD-10-CM | POA: Diagnosis not present

## 2022-06-17 DIAGNOSIS — N186 End stage renal disease: Secondary | ICD-10-CM | POA: Diagnosis not present

## 2022-06-18 DIAGNOSIS — N186 End stage renal disease: Secondary | ICD-10-CM | POA: Diagnosis not present

## 2022-06-19 DIAGNOSIS — N186 End stage renal disease: Secondary | ICD-10-CM | POA: Diagnosis not present

## 2022-06-20 DIAGNOSIS — N186 End stage renal disease: Secondary | ICD-10-CM | POA: Diagnosis not present

## 2022-06-21 DIAGNOSIS — N186 End stage renal disease: Secondary | ICD-10-CM | POA: Diagnosis not present

## 2022-06-22 DIAGNOSIS — N186 End stage renal disease: Secondary | ICD-10-CM | POA: Diagnosis not present

## 2022-06-23 DIAGNOSIS — D631 Anemia in chronic kidney disease: Secondary | ICD-10-CM | POA: Diagnosis not present

## 2022-06-23 DIAGNOSIS — I10 Essential (primary) hypertension: Secondary | ICD-10-CM | POA: Diagnosis not present

## 2022-06-23 DIAGNOSIS — N186 End stage renal disease: Secondary | ICD-10-CM | POA: Diagnosis not present

## 2022-06-24 DIAGNOSIS — N186 End stage renal disease: Secondary | ICD-10-CM | POA: Diagnosis not present

## 2022-06-25 DIAGNOSIS — N186 End stage renal disease: Secondary | ICD-10-CM | POA: Diagnosis not present

## 2022-06-26 DIAGNOSIS — N186 End stage renal disease: Secondary | ICD-10-CM | POA: Diagnosis not present

## 2022-06-27 DIAGNOSIS — N186 End stage renal disease: Secondary | ICD-10-CM | POA: Diagnosis not present

## 2022-06-28 DIAGNOSIS — N186 End stage renal disease: Secondary | ICD-10-CM | POA: Diagnosis not present

## 2022-06-29 DIAGNOSIS — N186 End stage renal disease: Secondary | ICD-10-CM | POA: Diagnosis not present

## 2022-06-30 DIAGNOSIS — N186 End stage renal disease: Secondary | ICD-10-CM | POA: Diagnosis not present

## 2022-06-30 DIAGNOSIS — Z01818 Encounter for other preprocedural examination: Secondary | ICD-10-CM | POA: Diagnosis not present

## 2022-06-30 DIAGNOSIS — Z1159 Encounter for screening for other viral diseases: Secondary | ICD-10-CM | POA: Diagnosis not present

## 2022-06-30 DIAGNOSIS — Z7682 Awaiting organ transplant status: Secondary | ICD-10-CM | POA: Diagnosis not present

## 2022-07-01 DIAGNOSIS — N186 End stage renal disease: Secondary | ICD-10-CM | POA: Diagnosis not present

## 2022-07-02 DIAGNOSIS — N186 End stage renal disease: Secondary | ICD-10-CM | POA: Diagnosis not present

## 2022-07-03 DIAGNOSIS — N186 End stage renal disease: Secondary | ICD-10-CM | POA: Diagnosis not present

## 2022-07-04 DIAGNOSIS — N186 End stage renal disease: Secondary | ICD-10-CM | POA: Diagnosis not present

## 2022-07-05 DIAGNOSIS — N186 End stage renal disease: Secondary | ICD-10-CM | POA: Diagnosis not present

## 2022-07-06 DIAGNOSIS — N186 End stage renal disease: Secondary | ICD-10-CM | POA: Diagnosis not present

## 2022-07-07 DIAGNOSIS — N186 End stage renal disease: Secondary | ICD-10-CM | POA: Diagnosis not present

## 2022-07-08 DIAGNOSIS — N186 End stage renal disease: Secondary | ICD-10-CM | POA: Diagnosis not present

## 2022-07-09 DIAGNOSIS — N186 End stage renal disease: Secondary | ICD-10-CM | POA: Diagnosis not present

## 2022-07-10 DIAGNOSIS — N186 End stage renal disease: Secondary | ICD-10-CM | POA: Diagnosis not present

## 2022-07-11 DIAGNOSIS — N186 End stage renal disease: Secondary | ICD-10-CM | POA: Diagnosis not present

## 2022-07-12 DIAGNOSIS — N186 End stage renal disease: Secondary | ICD-10-CM | POA: Diagnosis not present

## 2022-07-13 DIAGNOSIS — N186 End stage renal disease: Secondary | ICD-10-CM | POA: Diagnosis not present

## 2022-07-14 DIAGNOSIS — N186 End stage renal disease: Secondary | ICD-10-CM | POA: Diagnosis not present

## 2022-07-15 DIAGNOSIS — N186 End stage renal disease: Secondary | ICD-10-CM | POA: Diagnosis not present

## 2022-07-16 DIAGNOSIS — N186 End stage renal disease: Secondary | ICD-10-CM | POA: Diagnosis not present

## 2022-07-17 DIAGNOSIS — N186 End stage renal disease: Secondary | ICD-10-CM | POA: Diagnosis not present

## 2022-07-18 DIAGNOSIS — N186 End stage renal disease: Secondary | ICD-10-CM | POA: Diagnosis not present

## 2022-07-19 DIAGNOSIS — N186 End stage renal disease: Secondary | ICD-10-CM | POA: Diagnosis not present

## 2022-07-20 DIAGNOSIS — N186 End stage renal disease: Secondary | ICD-10-CM | POA: Diagnosis not present

## 2022-07-21 DIAGNOSIS — N186 End stage renal disease: Secondary | ICD-10-CM | POA: Diagnosis not present

## 2022-07-22 DIAGNOSIS — N186 End stage renal disease: Secondary | ICD-10-CM | POA: Diagnosis not present

## 2022-07-23 DIAGNOSIS — N186 End stage renal disease: Secondary | ICD-10-CM | POA: Diagnosis not present

## 2022-07-24 DIAGNOSIS — N186 End stage renal disease: Secondary | ICD-10-CM | POA: Diagnosis not present

## 2022-07-24 DIAGNOSIS — I10 Essential (primary) hypertension: Secondary | ICD-10-CM | POA: Diagnosis not present

## 2022-07-24 DIAGNOSIS — D631 Anemia in chronic kidney disease: Secondary | ICD-10-CM | POA: Diagnosis not present

## 2022-07-25 DIAGNOSIS — N186 End stage renal disease: Secondary | ICD-10-CM | POA: Diagnosis not present

## 2022-07-26 DIAGNOSIS — N186 End stage renal disease: Secondary | ICD-10-CM | POA: Diagnosis not present

## 2022-07-27 DIAGNOSIS — N186 End stage renal disease: Secondary | ICD-10-CM | POA: Diagnosis not present

## 2022-07-28 DIAGNOSIS — N186 End stage renal disease: Secondary | ICD-10-CM | POA: Diagnosis not present

## 2022-07-29 DIAGNOSIS — N186 End stage renal disease: Secondary | ICD-10-CM | POA: Diagnosis not present

## 2022-07-30 DIAGNOSIS — N186 End stage renal disease: Secondary | ICD-10-CM | POA: Diagnosis not present

## 2022-07-31 DIAGNOSIS — N186 End stage renal disease: Secondary | ICD-10-CM | POA: Diagnosis not present

## 2022-08-01 DIAGNOSIS — N186 End stage renal disease: Secondary | ICD-10-CM | POA: Diagnosis not present

## 2022-08-02 DIAGNOSIS — N186 End stage renal disease: Secondary | ICD-10-CM | POA: Diagnosis not present

## 2022-08-03 DIAGNOSIS — N186 End stage renal disease: Secondary | ICD-10-CM | POA: Diagnosis not present

## 2022-08-03 DIAGNOSIS — N2581 Secondary hyperparathyroidism of renal origin: Secondary | ICD-10-CM | POA: Diagnosis not present

## 2022-08-03 DIAGNOSIS — D509 Iron deficiency anemia, unspecified: Secondary | ICD-10-CM | POA: Diagnosis not present

## 2022-08-03 DIAGNOSIS — D631 Anemia in chronic kidney disease: Secondary | ICD-10-CM | POA: Diagnosis not present

## 2022-08-04 DIAGNOSIS — D631 Anemia in chronic kidney disease: Secondary | ICD-10-CM | POA: Diagnosis not present

## 2022-08-04 DIAGNOSIS — N2581 Secondary hyperparathyroidism of renal origin: Secondary | ICD-10-CM | POA: Diagnosis not present

## 2022-08-04 DIAGNOSIS — N186 End stage renal disease: Secondary | ICD-10-CM | POA: Diagnosis not present

## 2022-08-04 DIAGNOSIS — D509 Iron deficiency anemia, unspecified: Secondary | ICD-10-CM | POA: Diagnosis not present

## 2022-08-05 DIAGNOSIS — N186 End stage renal disease: Secondary | ICD-10-CM | POA: Diagnosis not present

## 2022-08-05 DIAGNOSIS — N2581 Secondary hyperparathyroidism of renal origin: Secondary | ICD-10-CM | POA: Diagnosis not present

## 2022-08-05 DIAGNOSIS — D631 Anemia in chronic kidney disease: Secondary | ICD-10-CM | POA: Diagnosis not present

## 2022-08-05 DIAGNOSIS — D509 Iron deficiency anemia, unspecified: Secondary | ICD-10-CM | POA: Diagnosis not present

## 2022-08-06 DIAGNOSIS — D631 Anemia in chronic kidney disease: Secondary | ICD-10-CM | POA: Diagnosis not present

## 2022-08-06 DIAGNOSIS — D509 Iron deficiency anemia, unspecified: Secondary | ICD-10-CM | POA: Diagnosis not present

## 2022-08-06 DIAGNOSIS — N2581 Secondary hyperparathyroidism of renal origin: Secondary | ICD-10-CM | POA: Diagnosis not present

## 2022-08-06 DIAGNOSIS — N186 End stage renal disease: Secondary | ICD-10-CM | POA: Diagnosis not present

## 2022-08-07 DIAGNOSIS — D509 Iron deficiency anemia, unspecified: Secondary | ICD-10-CM | POA: Diagnosis not present

## 2022-08-07 DIAGNOSIS — N186 End stage renal disease: Secondary | ICD-10-CM | POA: Diagnosis not present

## 2022-08-07 DIAGNOSIS — N2581 Secondary hyperparathyroidism of renal origin: Secondary | ICD-10-CM | POA: Diagnosis not present

## 2022-08-07 DIAGNOSIS — D631 Anemia in chronic kidney disease: Secondary | ICD-10-CM | POA: Diagnosis not present

## 2022-08-08 DIAGNOSIS — D509 Iron deficiency anemia, unspecified: Secondary | ICD-10-CM | POA: Diagnosis not present

## 2022-08-08 DIAGNOSIS — N186 End stage renal disease: Secondary | ICD-10-CM | POA: Diagnosis not present

## 2022-08-08 DIAGNOSIS — N2581 Secondary hyperparathyroidism of renal origin: Secondary | ICD-10-CM | POA: Diagnosis not present

## 2022-08-08 DIAGNOSIS — D631 Anemia in chronic kidney disease: Secondary | ICD-10-CM | POA: Diagnosis not present

## 2022-08-09 DIAGNOSIS — D631 Anemia in chronic kidney disease: Secondary | ICD-10-CM | POA: Diagnosis not present

## 2022-08-09 DIAGNOSIS — N186 End stage renal disease: Secondary | ICD-10-CM | POA: Diagnosis not present

## 2022-08-09 DIAGNOSIS — N2581 Secondary hyperparathyroidism of renal origin: Secondary | ICD-10-CM | POA: Diagnosis not present

## 2022-08-09 DIAGNOSIS — D509 Iron deficiency anemia, unspecified: Secondary | ICD-10-CM | POA: Diagnosis not present

## 2022-08-10 DIAGNOSIS — N2581 Secondary hyperparathyroidism of renal origin: Secondary | ICD-10-CM | POA: Diagnosis not present

## 2022-08-10 DIAGNOSIS — D509 Iron deficiency anemia, unspecified: Secondary | ICD-10-CM | POA: Diagnosis not present

## 2022-08-10 DIAGNOSIS — N186 End stage renal disease: Secondary | ICD-10-CM | POA: Diagnosis not present

## 2022-08-10 DIAGNOSIS — D631 Anemia in chronic kidney disease: Secondary | ICD-10-CM | POA: Diagnosis not present

## 2022-08-11 DIAGNOSIS — N186 End stage renal disease: Secondary | ICD-10-CM | POA: Diagnosis not present

## 2022-08-11 DIAGNOSIS — Z7682 Awaiting organ transplant status: Secondary | ICD-10-CM | POA: Diagnosis not present

## 2022-08-11 DIAGNOSIS — Z01818 Encounter for other preprocedural examination: Secondary | ICD-10-CM | POA: Diagnosis not present

## 2022-08-11 DIAGNOSIS — D631 Anemia in chronic kidney disease: Secondary | ICD-10-CM | POA: Diagnosis not present

## 2022-08-11 DIAGNOSIS — D509 Iron deficiency anemia, unspecified: Secondary | ICD-10-CM | POA: Diagnosis not present

## 2022-08-11 DIAGNOSIS — N2581 Secondary hyperparathyroidism of renal origin: Secondary | ICD-10-CM | POA: Diagnosis not present

## 2022-08-12 DIAGNOSIS — N186 End stage renal disease: Secondary | ICD-10-CM | POA: Diagnosis not present

## 2022-08-12 DIAGNOSIS — D631 Anemia in chronic kidney disease: Secondary | ICD-10-CM | POA: Diagnosis not present

## 2022-08-12 DIAGNOSIS — D509 Iron deficiency anemia, unspecified: Secondary | ICD-10-CM | POA: Diagnosis not present

## 2022-08-12 DIAGNOSIS — N2581 Secondary hyperparathyroidism of renal origin: Secondary | ICD-10-CM | POA: Diagnosis not present

## 2022-08-13 DIAGNOSIS — N186 End stage renal disease: Secondary | ICD-10-CM | POA: Diagnosis not present

## 2022-08-14 DIAGNOSIS — N186 End stage renal disease: Secondary | ICD-10-CM | POA: Diagnosis not present

## 2022-08-15 DIAGNOSIS — N186 End stage renal disease: Secondary | ICD-10-CM | POA: Diagnosis not present

## 2022-08-16 DIAGNOSIS — N186 End stage renal disease: Secondary | ICD-10-CM | POA: Diagnosis not present

## 2022-08-17 DIAGNOSIS — N186 End stage renal disease: Secondary | ICD-10-CM | POA: Diagnosis not present

## 2022-08-18 DIAGNOSIS — N186 End stage renal disease: Secondary | ICD-10-CM | POA: Diagnosis not present

## 2022-08-19 DIAGNOSIS — N186 End stage renal disease: Secondary | ICD-10-CM | POA: Diagnosis not present

## 2022-08-20 DIAGNOSIS — N186 End stage renal disease: Secondary | ICD-10-CM | POA: Diagnosis not present

## 2022-08-21 DIAGNOSIS — N186 End stage renal disease: Secondary | ICD-10-CM | POA: Diagnosis not present

## 2022-08-22 DIAGNOSIS — N186 End stage renal disease: Secondary | ICD-10-CM | POA: Diagnosis not present

## 2022-08-23 DIAGNOSIS — N186 End stage renal disease: Secondary | ICD-10-CM | POA: Diagnosis not present

## 2022-08-23 DIAGNOSIS — D509 Iron deficiency anemia, unspecified: Secondary | ICD-10-CM | POA: Diagnosis not present

## 2022-08-23 DIAGNOSIS — I10 Essential (primary) hypertension: Secondary | ICD-10-CM | POA: Diagnosis not present

## 2022-08-23 DIAGNOSIS — D631 Anemia in chronic kidney disease: Secondary | ICD-10-CM | POA: Diagnosis not present

## 2022-08-23 DIAGNOSIS — N2581 Secondary hyperparathyroidism of renal origin: Secondary | ICD-10-CM | POA: Diagnosis not present

## 2022-08-24 DIAGNOSIS — M7061 Trochanteric bursitis, right hip: Secondary | ICD-10-CM | POA: Diagnosis not present

## 2022-08-24 DIAGNOSIS — N2581 Secondary hyperparathyroidism of renal origin: Secondary | ICD-10-CM | POA: Diagnosis not present

## 2022-08-24 DIAGNOSIS — D631 Anemia in chronic kidney disease: Secondary | ICD-10-CM | POA: Diagnosis not present

## 2022-08-24 DIAGNOSIS — D509 Iron deficiency anemia, unspecified: Secondary | ICD-10-CM | POA: Diagnosis not present

## 2022-08-24 DIAGNOSIS — N186 End stage renal disease: Secondary | ICD-10-CM | POA: Diagnosis not present

## 2022-08-25 DIAGNOSIS — N186 End stage renal disease: Secondary | ICD-10-CM | POA: Diagnosis not present

## 2022-08-25 DIAGNOSIS — N2581 Secondary hyperparathyroidism of renal origin: Secondary | ICD-10-CM | POA: Diagnosis not present

## 2022-08-25 DIAGNOSIS — D509 Iron deficiency anemia, unspecified: Secondary | ICD-10-CM | POA: Diagnosis not present

## 2022-08-25 DIAGNOSIS — D631 Anemia in chronic kidney disease: Secondary | ICD-10-CM | POA: Diagnosis not present

## 2022-08-26 DIAGNOSIS — D631 Anemia in chronic kidney disease: Secondary | ICD-10-CM | POA: Diagnosis not present

## 2022-08-26 DIAGNOSIS — N2581 Secondary hyperparathyroidism of renal origin: Secondary | ICD-10-CM | POA: Diagnosis not present

## 2022-08-26 DIAGNOSIS — N186 End stage renal disease: Secondary | ICD-10-CM | POA: Diagnosis not present

## 2022-08-26 DIAGNOSIS — D509 Iron deficiency anemia, unspecified: Secondary | ICD-10-CM | POA: Diagnosis not present

## 2022-08-27 DIAGNOSIS — N186 End stage renal disease: Secondary | ICD-10-CM | POA: Diagnosis not present

## 2022-08-27 DIAGNOSIS — N2581 Secondary hyperparathyroidism of renal origin: Secondary | ICD-10-CM | POA: Diagnosis not present

## 2022-08-27 DIAGNOSIS — D631 Anemia in chronic kidney disease: Secondary | ICD-10-CM | POA: Diagnosis not present

## 2022-08-27 DIAGNOSIS — D509 Iron deficiency anemia, unspecified: Secondary | ICD-10-CM | POA: Diagnosis not present

## 2022-08-28 DIAGNOSIS — D509 Iron deficiency anemia, unspecified: Secondary | ICD-10-CM | POA: Diagnosis not present

## 2022-08-28 DIAGNOSIS — N186 End stage renal disease: Secondary | ICD-10-CM | POA: Diagnosis not present

## 2022-08-28 DIAGNOSIS — D631 Anemia in chronic kidney disease: Secondary | ICD-10-CM | POA: Diagnosis not present

## 2022-08-28 DIAGNOSIS — N2581 Secondary hyperparathyroidism of renal origin: Secondary | ICD-10-CM | POA: Diagnosis not present

## 2022-08-29 DIAGNOSIS — N186 End stage renal disease: Secondary | ICD-10-CM | POA: Diagnosis not present

## 2022-08-29 DIAGNOSIS — D509 Iron deficiency anemia, unspecified: Secondary | ICD-10-CM | POA: Diagnosis not present

## 2022-08-29 DIAGNOSIS — D631 Anemia in chronic kidney disease: Secondary | ICD-10-CM | POA: Diagnosis not present

## 2022-08-29 DIAGNOSIS — N2581 Secondary hyperparathyroidism of renal origin: Secondary | ICD-10-CM | POA: Diagnosis not present

## 2022-08-30 DIAGNOSIS — N186 End stage renal disease: Secondary | ICD-10-CM | POA: Diagnosis not present

## 2022-08-30 DIAGNOSIS — D509 Iron deficiency anemia, unspecified: Secondary | ICD-10-CM | POA: Diagnosis not present

## 2022-08-30 DIAGNOSIS — N2581 Secondary hyperparathyroidism of renal origin: Secondary | ICD-10-CM | POA: Diagnosis not present

## 2022-08-30 DIAGNOSIS — D631 Anemia in chronic kidney disease: Secondary | ICD-10-CM | POA: Diagnosis not present

## 2022-08-31 DIAGNOSIS — D631 Anemia in chronic kidney disease: Secondary | ICD-10-CM | POA: Diagnosis not present

## 2022-08-31 DIAGNOSIS — N186 End stage renal disease: Secondary | ICD-10-CM | POA: Diagnosis not present

## 2022-08-31 DIAGNOSIS — N2581 Secondary hyperparathyroidism of renal origin: Secondary | ICD-10-CM | POA: Diagnosis not present

## 2022-08-31 DIAGNOSIS — D509 Iron deficiency anemia, unspecified: Secondary | ICD-10-CM | POA: Diagnosis not present

## 2022-09-01 DIAGNOSIS — Z01818 Encounter for other preprocedural examination: Secondary | ICD-10-CM | POA: Diagnosis not present

## 2022-09-01 DIAGNOSIS — N186 End stage renal disease: Secondary | ICD-10-CM | POA: Diagnosis not present

## 2022-09-01 DIAGNOSIS — D509 Iron deficiency anemia, unspecified: Secondary | ICD-10-CM | POA: Diagnosis not present

## 2022-09-01 DIAGNOSIS — N2581 Secondary hyperparathyroidism of renal origin: Secondary | ICD-10-CM | POA: Diagnosis not present

## 2022-09-01 DIAGNOSIS — D631 Anemia in chronic kidney disease: Secondary | ICD-10-CM | POA: Diagnosis not present

## 2022-09-01 DIAGNOSIS — Z7682 Awaiting organ transplant status: Secondary | ICD-10-CM | POA: Diagnosis not present

## 2022-09-02 DIAGNOSIS — N186 End stage renal disease: Secondary | ICD-10-CM | POA: Diagnosis not present

## 2022-09-03 DIAGNOSIS — N186 End stage renal disease: Secondary | ICD-10-CM | POA: Diagnosis not present

## 2022-09-04 DIAGNOSIS — N186 End stage renal disease: Secondary | ICD-10-CM | POA: Diagnosis not present

## 2022-09-05 DIAGNOSIS — N186 End stage renal disease: Secondary | ICD-10-CM | POA: Diagnosis not present

## 2022-09-06 DIAGNOSIS — N186 End stage renal disease: Secondary | ICD-10-CM | POA: Diagnosis not present

## 2022-09-07 DIAGNOSIS — N186 End stage renal disease: Secondary | ICD-10-CM | POA: Diagnosis not present

## 2022-09-08 DIAGNOSIS — N186 End stage renal disease: Secondary | ICD-10-CM | POA: Diagnosis not present

## 2022-09-09 DIAGNOSIS — N186 End stage renal disease: Secondary | ICD-10-CM | POA: Diagnosis not present

## 2022-09-10 DIAGNOSIS — N186 End stage renal disease: Secondary | ICD-10-CM | POA: Diagnosis not present

## 2022-09-11 DIAGNOSIS — N186 End stage renal disease: Secondary | ICD-10-CM | POA: Diagnosis not present

## 2022-09-12 DIAGNOSIS — N186 End stage renal disease: Secondary | ICD-10-CM | POA: Diagnosis not present

## 2022-09-13 DIAGNOSIS — N186 End stage renal disease: Secondary | ICD-10-CM | POA: Diagnosis not present

## 2022-09-14 DIAGNOSIS — N186 End stage renal disease: Secondary | ICD-10-CM | POA: Diagnosis not present

## 2022-09-15 DIAGNOSIS — N186 End stage renal disease: Secondary | ICD-10-CM | POA: Diagnosis not present

## 2022-09-16 DIAGNOSIS — N186 End stage renal disease: Secondary | ICD-10-CM | POA: Diagnosis not present

## 2022-09-17 DIAGNOSIS — N186 End stage renal disease: Secondary | ICD-10-CM | POA: Diagnosis not present

## 2022-09-18 DIAGNOSIS — N186 End stage renal disease: Secondary | ICD-10-CM | POA: Diagnosis not present

## 2022-09-19 DIAGNOSIS — N186 End stage renal disease: Secondary | ICD-10-CM | POA: Diagnosis not present

## 2022-09-20 DIAGNOSIS — N186 End stage renal disease: Secondary | ICD-10-CM | POA: Diagnosis not present

## 2022-09-21 DIAGNOSIS — N186 End stage renal disease: Secondary | ICD-10-CM | POA: Diagnosis not present

## 2022-09-21 DIAGNOSIS — M7989 Other specified soft tissue disorders: Secondary | ICD-10-CM | POA: Diagnosis not present

## 2022-09-21 DIAGNOSIS — I10 Essential (primary) hypertension: Secondary | ICD-10-CM | POA: Diagnosis not present

## 2022-09-22 DIAGNOSIS — N186 End stage renal disease: Secondary | ICD-10-CM | POA: Diagnosis not present

## 2022-09-23 DIAGNOSIS — I10 Essential (primary) hypertension: Secondary | ICD-10-CM | POA: Diagnosis not present

## 2022-09-23 DIAGNOSIS — N186 End stage renal disease: Secondary | ICD-10-CM | POA: Diagnosis not present

## 2022-09-23 DIAGNOSIS — D631 Anemia in chronic kidney disease: Secondary | ICD-10-CM | POA: Diagnosis not present

## 2022-09-24 DIAGNOSIS — N186 End stage renal disease: Secondary | ICD-10-CM | POA: Diagnosis not present

## 2022-09-25 DIAGNOSIS — N186 End stage renal disease: Secondary | ICD-10-CM | POA: Diagnosis not present

## 2022-09-26 DIAGNOSIS — N186 End stage renal disease: Secondary | ICD-10-CM | POA: Diagnosis not present

## 2022-09-27 DIAGNOSIS — N186 End stage renal disease: Secondary | ICD-10-CM | POA: Diagnosis not present

## 2022-09-28 DIAGNOSIS — N186 End stage renal disease: Secondary | ICD-10-CM | POA: Diagnosis not present

## 2022-09-29 DIAGNOSIS — N186 End stage renal disease: Secondary | ICD-10-CM | POA: Diagnosis not present

## 2022-09-30 DIAGNOSIS — N186 End stage renal disease: Secondary | ICD-10-CM | POA: Diagnosis not present

## 2022-10-01 DIAGNOSIS — N186 End stage renal disease: Secondary | ICD-10-CM | POA: Diagnosis not present

## 2022-10-02 DIAGNOSIS — N186 End stage renal disease: Secondary | ICD-10-CM | POA: Diagnosis not present

## 2022-10-03 DIAGNOSIS — D631 Anemia in chronic kidney disease: Secondary | ICD-10-CM | POA: Diagnosis not present

## 2022-10-03 DIAGNOSIS — N2581 Secondary hyperparathyroidism of renal origin: Secondary | ICD-10-CM | POA: Diagnosis not present

## 2022-10-03 DIAGNOSIS — N186 End stage renal disease: Secondary | ICD-10-CM | POA: Diagnosis not present

## 2022-10-04 DIAGNOSIS — D631 Anemia in chronic kidney disease: Secondary | ICD-10-CM | POA: Diagnosis not present

## 2022-10-04 DIAGNOSIS — N2581 Secondary hyperparathyroidism of renal origin: Secondary | ICD-10-CM | POA: Diagnosis not present

## 2022-10-04 DIAGNOSIS — N186 End stage renal disease: Secondary | ICD-10-CM | POA: Diagnosis not present

## 2022-10-05 DIAGNOSIS — N2581 Secondary hyperparathyroidism of renal origin: Secondary | ICD-10-CM | POA: Diagnosis not present

## 2022-10-05 DIAGNOSIS — N186 End stage renal disease: Secondary | ICD-10-CM | POA: Diagnosis not present

## 2022-10-05 DIAGNOSIS — D631 Anemia in chronic kidney disease: Secondary | ICD-10-CM | POA: Diagnosis not present

## 2022-10-06 DIAGNOSIS — N186 End stage renal disease: Secondary | ICD-10-CM | POA: Diagnosis not present

## 2022-10-06 DIAGNOSIS — Z7682 Awaiting organ transplant status: Secondary | ICD-10-CM | POA: Diagnosis not present

## 2022-10-06 DIAGNOSIS — M25552 Pain in left hip: Secondary | ICD-10-CM | POA: Diagnosis not present

## 2022-10-06 DIAGNOSIS — Z01818 Encounter for other preprocedural examination: Secondary | ICD-10-CM | POA: Diagnosis not present

## 2022-10-06 DIAGNOSIS — D631 Anemia in chronic kidney disease: Secondary | ICD-10-CM | POA: Diagnosis not present

## 2022-10-06 DIAGNOSIS — N2581 Secondary hyperparathyroidism of renal origin: Secondary | ICD-10-CM | POA: Diagnosis not present

## 2022-10-07 DIAGNOSIS — D631 Anemia in chronic kidney disease: Secondary | ICD-10-CM | POA: Diagnosis not present

## 2022-10-07 DIAGNOSIS — N186 End stage renal disease: Secondary | ICD-10-CM | POA: Diagnosis not present

## 2022-10-07 DIAGNOSIS — N2581 Secondary hyperparathyroidism of renal origin: Secondary | ICD-10-CM | POA: Diagnosis not present

## 2022-10-09 DIAGNOSIS — N2581 Secondary hyperparathyroidism of renal origin: Secondary | ICD-10-CM | POA: Diagnosis not present

## 2022-10-09 DIAGNOSIS — D631 Anemia in chronic kidney disease: Secondary | ICD-10-CM | POA: Diagnosis not present

## 2022-10-09 DIAGNOSIS — N186 End stage renal disease: Secondary | ICD-10-CM | POA: Diagnosis not present

## 2022-10-10 DIAGNOSIS — N2581 Secondary hyperparathyroidism of renal origin: Secondary | ICD-10-CM | POA: Diagnosis not present

## 2022-10-10 DIAGNOSIS — N186 End stage renal disease: Secondary | ICD-10-CM | POA: Diagnosis not present

## 2022-10-10 DIAGNOSIS — D631 Anemia in chronic kidney disease: Secondary | ICD-10-CM | POA: Diagnosis not present

## 2022-10-11 DIAGNOSIS — N186 End stage renal disease: Secondary | ICD-10-CM | POA: Diagnosis not present

## 2022-10-11 DIAGNOSIS — N2581 Secondary hyperparathyroidism of renal origin: Secondary | ICD-10-CM | POA: Diagnosis not present

## 2022-10-11 DIAGNOSIS — D631 Anemia in chronic kidney disease: Secondary | ICD-10-CM | POA: Diagnosis not present

## 2022-10-12 DIAGNOSIS — D631 Anemia in chronic kidney disease: Secondary | ICD-10-CM | POA: Diagnosis not present

## 2022-10-12 DIAGNOSIS — N186 End stage renal disease: Secondary | ICD-10-CM | POA: Diagnosis not present

## 2022-10-12 DIAGNOSIS — N2581 Secondary hyperparathyroidism of renal origin: Secondary | ICD-10-CM | POA: Diagnosis not present

## 2022-10-13 DIAGNOSIS — N186 End stage renal disease: Secondary | ICD-10-CM | POA: Diagnosis not present

## 2022-10-14 DIAGNOSIS — N186 End stage renal disease: Secondary | ICD-10-CM | POA: Diagnosis not present

## 2022-10-16 DIAGNOSIS — N186 End stage renal disease: Secondary | ICD-10-CM | POA: Diagnosis not present

## 2022-10-17 DIAGNOSIS — I10 Essential (primary) hypertension: Secondary | ICD-10-CM | POA: Diagnosis not present

## 2022-10-17 DIAGNOSIS — R0902 Hypoxemia: Secondary | ICD-10-CM | POA: Diagnosis not present

## 2022-10-17 DIAGNOSIS — N186 End stage renal disease: Secondary | ICD-10-CM | POA: Diagnosis not present

## 2022-10-17 DIAGNOSIS — Z992 Dependence on renal dialysis: Secondary | ICD-10-CM | POA: Diagnosis not present

## 2022-10-17 DIAGNOSIS — I2510 Atherosclerotic heart disease of native coronary artery without angina pectoris: Secondary | ICD-10-CM | POA: Diagnosis not present

## 2022-10-17 DIAGNOSIS — Z8673 Personal history of transient ischemic attack (TIA), and cerebral infarction without residual deficits: Secondary | ICD-10-CM | POA: Diagnosis not present

## 2022-10-17 DIAGNOSIS — Z7682 Awaiting organ transplant status: Secondary | ICD-10-CM | POA: Diagnosis not present

## 2022-10-17 DIAGNOSIS — R0689 Other abnormalities of breathing: Secondary | ICD-10-CM | POA: Diagnosis not present

## 2022-10-17 DIAGNOSIS — E875 Hyperkalemia: Secondary | ICD-10-CM | POA: Diagnosis not present

## 2022-10-17 DIAGNOSIS — I120 Hypertensive chronic kidney disease with stage 5 chronic kidney disease or end stage renal disease: Secondary | ICD-10-CM | POA: Diagnosis not present

## 2022-10-17 DIAGNOSIS — I517 Cardiomegaly: Secondary | ICD-10-CM | POA: Diagnosis not present

## 2022-10-17 DIAGNOSIS — Z7982 Long term (current) use of aspirin: Secondary | ICD-10-CM | POA: Diagnosis not present

## 2022-10-17 DIAGNOSIS — J9601 Acute respiratory failure with hypoxia: Secondary | ICD-10-CM | POA: Diagnosis not present

## 2022-10-17 DIAGNOSIS — R0602 Shortness of breath: Secondary | ICD-10-CM | POA: Diagnosis not present

## 2022-10-17 DIAGNOSIS — R000 Tachycardia, unspecified: Secondary | ICD-10-CM | POA: Diagnosis not present

## 2022-10-17 DIAGNOSIS — D649 Anemia, unspecified: Secondary | ICD-10-CM | POA: Diagnosis not present

## 2022-10-17 DIAGNOSIS — J9690 Respiratory failure, unspecified, unspecified whether with hypoxia or hypercapnia: Secondary | ICD-10-CM | POA: Diagnosis not present

## 2022-10-17 DIAGNOSIS — Z79899 Other long term (current) drug therapy: Secondary | ICD-10-CM | POA: Diagnosis not present

## 2022-10-17 DIAGNOSIS — I161 Hypertensive emergency: Secondary | ICD-10-CM | POA: Diagnosis not present

## 2022-10-17 DIAGNOSIS — K219 Gastro-esophageal reflux disease without esophagitis: Secondary | ICD-10-CM | POA: Diagnosis not present

## 2022-10-17 DIAGNOSIS — J810 Acute pulmonary edema: Secondary | ICD-10-CM | POA: Diagnosis not present

## 2022-10-17 DIAGNOSIS — J811 Chronic pulmonary edema: Secondary | ICD-10-CM | POA: Diagnosis not present

## 2022-10-17 DIAGNOSIS — I080 Rheumatic disorders of both mitral and aortic valves: Secondary | ICD-10-CM | POA: Diagnosis not present

## 2022-10-17 DIAGNOSIS — D631 Anemia in chronic kidney disease: Secondary | ICD-10-CM | POA: Diagnosis not present

## 2022-10-17 DIAGNOSIS — R55 Syncope and collapse: Secondary | ICD-10-CM | POA: Diagnosis not present

## 2022-10-19 DIAGNOSIS — N186 End stage renal disease: Secondary | ICD-10-CM | POA: Diagnosis not present

## 2022-10-20 DIAGNOSIS — N186 End stage renal disease: Secondary | ICD-10-CM | POA: Diagnosis not present

## 2022-10-21 DIAGNOSIS — N186 End stage renal disease: Secondary | ICD-10-CM | POA: Diagnosis not present

## 2022-10-23 DIAGNOSIS — I10 Essential (primary) hypertension: Secondary | ICD-10-CM | POA: Diagnosis not present

## 2022-10-23 DIAGNOSIS — D631 Anemia in chronic kidney disease: Secondary | ICD-10-CM | POA: Diagnosis not present

## 2022-10-23 DIAGNOSIS — N186 End stage renal disease: Secondary | ICD-10-CM | POA: Diagnosis not present

## 2022-10-24 DIAGNOSIS — N186 End stage renal disease: Secondary | ICD-10-CM | POA: Diagnosis not present

## 2022-10-25 DIAGNOSIS — N186 End stage renal disease: Secondary | ICD-10-CM | POA: Diagnosis not present

## 2022-10-26 DIAGNOSIS — N186 End stage renal disease: Secondary | ICD-10-CM | POA: Diagnosis not present

## 2022-10-27 DIAGNOSIS — N186 End stage renal disease: Secondary | ICD-10-CM | POA: Diagnosis not present

## 2022-10-28 DIAGNOSIS — N186 End stage renal disease: Secondary | ICD-10-CM | POA: Diagnosis not present

## 2022-10-29 DIAGNOSIS — N186 End stage renal disease: Secondary | ICD-10-CM | POA: Diagnosis not present

## 2022-10-30 DIAGNOSIS — N186 End stage renal disease: Secondary | ICD-10-CM | POA: Diagnosis not present

## 2022-10-31 DIAGNOSIS — N186 End stage renal disease: Secondary | ICD-10-CM | POA: Diagnosis not present

## 2022-11-01 DIAGNOSIS — N186 End stage renal disease: Secondary | ICD-10-CM | POA: Diagnosis not present

## 2022-11-02 DIAGNOSIS — N186 End stage renal disease: Secondary | ICD-10-CM | POA: Diagnosis not present

## 2022-11-02 DIAGNOSIS — D509 Iron deficiency anemia, unspecified: Secondary | ICD-10-CM | POA: Diagnosis not present

## 2022-11-02 DIAGNOSIS — M545 Low back pain, unspecified: Secondary | ICD-10-CM | POA: Diagnosis not present

## 2022-11-02 DIAGNOSIS — D631 Anemia in chronic kidney disease: Secondary | ICD-10-CM | POA: Diagnosis not present

## 2022-11-02 DIAGNOSIS — N2581 Secondary hyperparathyroidism of renal origin: Secondary | ICD-10-CM | POA: Diagnosis not present

## 2022-11-03 DIAGNOSIS — Z01818 Encounter for other preprocedural examination: Secondary | ICD-10-CM | POA: Diagnosis not present

## 2022-11-03 DIAGNOSIS — N186 End stage renal disease: Secondary | ICD-10-CM | POA: Diagnosis not present

## 2022-11-03 DIAGNOSIS — D631 Anemia in chronic kidney disease: Secondary | ICD-10-CM | POA: Diagnosis not present

## 2022-11-03 DIAGNOSIS — D509 Iron deficiency anemia, unspecified: Secondary | ICD-10-CM | POA: Diagnosis not present

## 2022-11-03 DIAGNOSIS — Z7682 Awaiting organ transplant status: Secondary | ICD-10-CM | POA: Diagnosis not present

## 2022-11-03 DIAGNOSIS — N2581 Secondary hyperparathyroidism of renal origin: Secondary | ICD-10-CM | POA: Diagnosis not present

## 2022-11-04 DIAGNOSIS — N2581 Secondary hyperparathyroidism of renal origin: Secondary | ICD-10-CM | POA: Diagnosis not present

## 2022-11-04 DIAGNOSIS — D631 Anemia in chronic kidney disease: Secondary | ICD-10-CM | POA: Diagnosis not present

## 2022-11-04 DIAGNOSIS — D509 Iron deficiency anemia, unspecified: Secondary | ICD-10-CM | POA: Diagnosis not present

## 2022-11-04 DIAGNOSIS — N186 End stage renal disease: Secondary | ICD-10-CM | POA: Diagnosis not present

## 2022-11-05 DIAGNOSIS — D509 Iron deficiency anemia, unspecified: Secondary | ICD-10-CM | POA: Diagnosis not present

## 2022-11-05 DIAGNOSIS — D631 Anemia in chronic kidney disease: Secondary | ICD-10-CM | POA: Diagnosis not present

## 2022-11-05 DIAGNOSIS — N2581 Secondary hyperparathyroidism of renal origin: Secondary | ICD-10-CM | POA: Diagnosis not present

## 2022-11-05 DIAGNOSIS — N186 End stage renal disease: Secondary | ICD-10-CM | POA: Diagnosis not present

## 2022-11-06 DIAGNOSIS — N2581 Secondary hyperparathyroidism of renal origin: Secondary | ICD-10-CM | POA: Diagnosis not present

## 2022-11-06 DIAGNOSIS — D509 Iron deficiency anemia, unspecified: Secondary | ICD-10-CM | POA: Diagnosis not present

## 2022-11-06 DIAGNOSIS — D631 Anemia in chronic kidney disease: Secondary | ICD-10-CM | POA: Diagnosis not present

## 2022-11-06 DIAGNOSIS — N186 End stage renal disease: Secondary | ICD-10-CM | POA: Diagnosis not present

## 2022-11-07 ENCOUNTER — Other Ambulatory Visit: Payer: Self-pay | Admitting: Internal Medicine

## 2022-11-07 DIAGNOSIS — Z1231 Encounter for screening mammogram for malignant neoplasm of breast: Secondary | ICD-10-CM

## 2022-11-07 DIAGNOSIS — D509 Iron deficiency anemia, unspecified: Secondary | ICD-10-CM | POA: Diagnosis not present

## 2022-11-07 DIAGNOSIS — N2581 Secondary hyperparathyroidism of renal origin: Secondary | ICD-10-CM | POA: Diagnosis not present

## 2022-11-07 DIAGNOSIS — D631 Anemia in chronic kidney disease: Secondary | ICD-10-CM | POA: Diagnosis not present

## 2022-11-07 DIAGNOSIS — N186 End stage renal disease: Secondary | ICD-10-CM | POA: Diagnosis not present

## 2022-11-08 DIAGNOSIS — N186 End stage renal disease: Secondary | ICD-10-CM | POA: Diagnosis not present

## 2022-11-08 DIAGNOSIS — D509 Iron deficiency anemia, unspecified: Secondary | ICD-10-CM | POA: Diagnosis not present

## 2022-11-08 DIAGNOSIS — D631 Anemia in chronic kidney disease: Secondary | ICD-10-CM | POA: Diagnosis not present

## 2022-11-08 DIAGNOSIS — N2581 Secondary hyperparathyroidism of renal origin: Secondary | ICD-10-CM | POA: Diagnosis not present

## 2022-11-09 DIAGNOSIS — D509 Iron deficiency anemia, unspecified: Secondary | ICD-10-CM | POA: Diagnosis not present

## 2022-11-09 DIAGNOSIS — N186 End stage renal disease: Secondary | ICD-10-CM | POA: Diagnosis not present

## 2022-11-09 DIAGNOSIS — N2581 Secondary hyperparathyroidism of renal origin: Secondary | ICD-10-CM | POA: Diagnosis not present

## 2022-11-09 DIAGNOSIS — D631 Anemia in chronic kidney disease: Secondary | ICD-10-CM | POA: Diagnosis not present

## 2022-11-10 DIAGNOSIS — D509 Iron deficiency anemia, unspecified: Secondary | ICD-10-CM | POA: Diagnosis not present

## 2022-11-10 DIAGNOSIS — N2581 Secondary hyperparathyroidism of renal origin: Secondary | ICD-10-CM | POA: Diagnosis not present

## 2022-11-10 DIAGNOSIS — D631 Anemia in chronic kidney disease: Secondary | ICD-10-CM | POA: Diagnosis not present

## 2022-11-10 DIAGNOSIS — N186 End stage renal disease: Secondary | ICD-10-CM | POA: Diagnosis not present

## 2022-11-11 DIAGNOSIS — D631 Anemia in chronic kidney disease: Secondary | ICD-10-CM | POA: Diagnosis not present

## 2022-11-11 DIAGNOSIS — N2581 Secondary hyperparathyroidism of renal origin: Secondary | ICD-10-CM | POA: Diagnosis not present

## 2022-11-11 DIAGNOSIS — D509 Iron deficiency anemia, unspecified: Secondary | ICD-10-CM | POA: Diagnosis not present

## 2022-11-11 DIAGNOSIS — N186 End stage renal disease: Secondary | ICD-10-CM | POA: Diagnosis not present

## 2022-11-12 DIAGNOSIS — N186 End stage renal disease: Secondary | ICD-10-CM | POA: Diagnosis not present

## 2022-11-13 DIAGNOSIS — N186 End stage renal disease: Secondary | ICD-10-CM | POA: Diagnosis not present

## 2022-11-14 ENCOUNTER — Ambulatory Visit
Admission: RE | Admit: 2022-11-14 | Discharge: 2022-11-14 | Disposition: A | Discharge status: Home / Self Care | Payer: BC Managed Care – PPO | Source: Ambulatory Visit | Attending: Internal Medicine | Admitting: Internal Medicine

## 2022-11-14 DIAGNOSIS — Z1231 Encounter for screening mammogram for malignant neoplasm of breast: Secondary | ICD-10-CM | POA: Diagnosis not present

## 2022-11-14 DIAGNOSIS — N186 End stage renal disease: Secondary | ICD-10-CM | POA: Diagnosis not present

## 2022-11-15 DIAGNOSIS — N186 End stage renal disease: Secondary | ICD-10-CM | POA: Diagnosis not present

## 2022-11-16 DIAGNOSIS — N186 End stage renal disease: Secondary | ICD-10-CM | POA: Diagnosis not present

## 2022-11-17 DIAGNOSIS — N186 End stage renal disease: Secondary | ICD-10-CM | POA: Diagnosis not present

## 2022-11-18 DIAGNOSIS — N186 End stage renal disease: Secondary | ICD-10-CM | POA: Diagnosis not present

## 2022-11-19 DIAGNOSIS — N186 End stage renal disease: Secondary | ICD-10-CM | POA: Diagnosis not present

## 2022-11-20 DIAGNOSIS — N186 End stage renal disease: Secondary | ICD-10-CM | POA: Diagnosis not present

## 2022-11-21 DIAGNOSIS — N186 End stage renal disease: Secondary | ICD-10-CM | POA: Diagnosis not present

## 2022-11-22 DIAGNOSIS — N186 End stage renal disease: Secondary | ICD-10-CM | POA: Diagnosis not present

## 2022-11-23 DIAGNOSIS — D509 Iron deficiency anemia, unspecified: Secondary | ICD-10-CM | POA: Diagnosis not present

## 2022-11-23 DIAGNOSIS — I10 Essential (primary) hypertension: Secondary | ICD-10-CM | POA: Diagnosis not present

## 2022-11-23 DIAGNOSIS — D631 Anemia in chronic kidney disease: Secondary | ICD-10-CM | POA: Diagnosis not present

## 2022-11-23 DIAGNOSIS — N2581 Secondary hyperparathyroidism of renal origin: Secondary | ICD-10-CM | POA: Diagnosis not present

## 2022-11-23 DIAGNOSIS — N186 End stage renal disease: Secondary | ICD-10-CM | POA: Diagnosis not present

## 2022-11-24 DIAGNOSIS — N2581 Secondary hyperparathyroidism of renal origin: Secondary | ICD-10-CM | POA: Diagnosis not present

## 2022-11-24 DIAGNOSIS — N186 End stage renal disease: Secondary | ICD-10-CM | POA: Diagnosis not present

## 2022-11-24 DIAGNOSIS — D509 Iron deficiency anemia, unspecified: Secondary | ICD-10-CM | POA: Diagnosis not present

## 2022-11-24 DIAGNOSIS — D631 Anemia in chronic kidney disease: Secondary | ICD-10-CM | POA: Diagnosis not present

## 2022-11-25 DIAGNOSIS — D631 Anemia in chronic kidney disease: Secondary | ICD-10-CM | POA: Diagnosis not present

## 2022-11-25 DIAGNOSIS — N186 End stage renal disease: Secondary | ICD-10-CM | POA: Diagnosis not present

## 2022-11-25 DIAGNOSIS — N2581 Secondary hyperparathyroidism of renal origin: Secondary | ICD-10-CM | POA: Diagnosis not present

## 2022-11-25 DIAGNOSIS — D509 Iron deficiency anemia, unspecified: Secondary | ICD-10-CM | POA: Diagnosis not present

## 2022-11-26 DIAGNOSIS — D631 Anemia in chronic kidney disease: Secondary | ICD-10-CM | POA: Diagnosis not present

## 2022-11-26 DIAGNOSIS — N186 End stage renal disease: Secondary | ICD-10-CM | POA: Diagnosis not present

## 2022-11-26 DIAGNOSIS — N2581 Secondary hyperparathyroidism of renal origin: Secondary | ICD-10-CM | POA: Diagnosis not present

## 2022-11-26 DIAGNOSIS — D509 Iron deficiency anemia, unspecified: Secondary | ICD-10-CM | POA: Diagnosis not present

## 2022-11-27 DIAGNOSIS — N186 End stage renal disease: Secondary | ICD-10-CM | POA: Diagnosis not present

## 2022-11-27 DIAGNOSIS — D509 Iron deficiency anemia, unspecified: Secondary | ICD-10-CM | POA: Diagnosis not present

## 2022-11-27 DIAGNOSIS — N2581 Secondary hyperparathyroidism of renal origin: Secondary | ICD-10-CM | POA: Diagnosis not present

## 2022-11-27 DIAGNOSIS — D631 Anemia in chronic kidney disease: Secondary | ICD-10-CM | POA: Diagnosis not present

## 2022-11-28 DIAGNOSIS — N2581 Secondary hyperparathyroidism of renal origin: Secondary | ICD-10-CM | POA: Diagnosis not present

## 2022-11-28 DIAGNOSIS — D509 Iron deficiency anemia, unspecified: Secondary | ICD-10-CM | POA: Diagnosis not present

## 2022-11-28 DIAGNOSIS — N186 End stage renal disease: Secondary | ICD-10-CM | POA: Diagnosis not present

## 2022-11-28 DIAGNOSIS — D631 Anemia in chronic kidney disease: Secondary | ICD-10-CM | POA: Diagnosis not present

## 2022-11-29 DIAGNOSIS — N186 End stage renal disease: Secondary | ICD-10-CM | POA: Diagnosis not present

## 2022-11-29 DIAGNOSIS — D509 Iron deficiency anemia, unspecified: Secondary | ICD-10-CM | POA: Diagnosis not present

## 2022-11-29 DIAGNOSIS — N2581 Secondary hyperparathyroidism of renal origin: Secondary | ICD-10-CM | POA: Diagnosis not present

## 2022-11-29 DIAGNOSIS — D631 Anemia in chronic kidney disease: Secondary | ICD-10-CM | POA: Diagnosis not present

## 2022-11-30 DIAGNOSIS — N186 End stage renal disease: Secondary | ICD-10-CM | POA: Diagnosis not present

## 2022-11-30 DIAGNOSIS — D509 Iron deficiency anemia, unspecified: Secondary | ICD-10-CM | POA: Diagnosis not present

## 2022-11-30 DIAGNOSIS — D631 Anemia in chronic kidney disease: Secondary | ICD-10-CM | POA: Diagnosis not present

## 2022-11-30 DIAGNOSIS — N2581 Secondary hyperparathyroidism of renal origin: Secondary | ICD-10-CM | POA: Diagnosis not present

## 2022-12-01 DIAGNOSIS — Z01818 Encounter for other preprocedural examination: Secondary | ICD-10-CM | POA: Diagnosis not present

## 2022-12-01 DIAGNOSIS — D509 Iron deficiency anemia, unspecified: Secondary | ICD-10-CM | POA: Diagnosis not present

## 2022-12-01 DIAGNOSIS — N186 End stage renal disease: Secondary | ICD-10-CM | POA: Diagnosis not present

## 2022-12-01 DIAGNOSIS — Z7682 Awaiting organ transplant status: Secondary | ICD-10-CM | POA: Diagnosis not present

## 2022-12-01 DIAGNOSIS — N2581 Secondary hyperparathyroidism of renal origin: Secondary | ICD-10-CM | POA: Diagnosis not present

## 2022-12-01 DIAGNOSIS — D631 Anemia in chronic kidney disease: Secondary | ICD-10-CM | POA: Diagnosis not present

## 2022-12-02 DIAGNOSIS — D509 Iron deficiency anemia, unspecified: Secondary | ICD-10-CM | POA: Diagnosis not present

## 2022-12-02 DIAGNOSIS — N186 End stage renal disease: Secondary | ICD-10-CM | POA: Diagnosis not present

## 2022-12-02 DIAGNOSIS — D631 Anemia in chronic kidney disease: Secondary | ICD-10-CM | POA: Diagnosis not present

## 2022-12-02 DIAGNOSIS — N2581 Secondary hyperparathyroidism of renal origin: Secondary | ICD-10-CM | POA: Diagnosis not present

## 2022-12-03 DIAGNOSIS — N186 End stage renal disease: Secondary | ICD-10-CM | POA: Diagnosis not present

## 2022-12-04 DIAGNOSIS — N186 End stage renal disease: Secondary | ICD-10-CM | POA: Diagnosis not present

## 2022-12-05 DIAGNOSIS — N186 End stage renal disease: Secondary | ICD-10-CM | POA: Diagnosis not present

## 2022-12-06 DIAGNOSIS — N186 End stage renal disease: Secondary | ICD-10-CM | POA: Diagnosis not present

## 2022-12-07 DIAGNOSIS — N186 End stage renal disease: Secondary | ICD-10-CM | POA: Diagnosis not present

## 2022-12-08 DIAGNOSIS — N186 End stage renal disease: Secondary | ICD-10-CM | POA: Diagnosis not present

## 2022-12-09 DIAGNOSIS — N186 End stage renal disease: Secondary | ICD-10-CM | POA: Diagnosis not present

## 2022-12-10 DIAGNOSIS — N186 End stage renal disease: Secondary | ICD-10-CM | POA: Diagnosis not present

## 2022-12-11 DIAGNOSIS — N186 End stage renal disease: Secondary | ICD-10-CM | POA: Diagnosis not present

## 2022-12-14 DIAGNOSIS — N186 End stage renal disease: Secondary | ICD-10-CM | POA: Diagnosis not present

## 2022-12-15 DIAGNOSIS — N186 End stage renal disease: Secondary | ICD-10-CM | POA: Diagnosis not present

## 2022-12-16 DIAGNOSIS — N186 End stage renal disease: Secondary | ICD-10-CM | POA: Diagnosis not present

## 2022-12-17 DIAGNOSIS — N186 End stage renal disease: Secondary | ICD-10-CM | POA: Diagnosis not present

## 2022-12-18 DIAGNOSIS — N186 End stage renal disease: Secondary | ICD-10-CM | POA: Diagnosis not present

## 2022-12-19 DIAGNOSIS — Z13220 Encounter for screening for lipoid disorders: Secondary | ICD-10-CM | POA: Diagnosis not present

## 2022-12-19 DIAGNOSIS — N186 End stage renal disease: Secondary | ICD-10-CM | POA: Diagnosis not present

## 2022-12-19 DIAGNOSIS — Z0000 Encounter for general adult medical examination without abnormal findings: Secondary | ICD-10-CM | POA: Diagnosis not present

## 2022-12-19 DIAGNOSIS — Z992 Dependence on renal dialysis: Secondary | ICD-10-CM | POA: Diagnosis not present

## 2022-12-19 DIAGNOSIS — R202 Paresthesia of skin: Secondary | ICD-10-CM | POA: Diagnosis not present

## 2022-12-19 DIAGNOSIS — M109 Gout, unspecified: Secondary | ICD-10-CM | POA: Diagnosis not present

## 2022-12-19 DIAGNOSIS — I10 Essential (primary) hypertension: Secondary | ICD-10-CM | POA: Diagnosis not present

## 2022-12-19 DIAGNOSIS — F411 Generalized anxiety disorder: Secondary | ICD-10-CM | POA: Diagnosis not present

## 2022-12-20 DIAGNOSIS — N186 End stage renal disease: Secondary | ICD-10-CM | POA: Diagnosis not present

## 2022-12-21 DIAGNOSIS — N186 End stage renal disease: Secondary | ICD-10-CM | POA: Diagnosis not present

## 2022-12-23 DIAGNOSIS — N186 End stage renal disease: Secondary | ICD-10-CM | POA: Diagnosis not present

## 2022-12-24 DIAGNOSIS — D631 Anemia in chronic kidney disease: Secondary | ICD-10-CM | POA: Diagnosis not present

## 2022-12-24 DIAGNOSIS — N186 End stage renal disease: Secondary | ICD-10-CM | POA: Diagnosis not present

## 2022-12-24 DIAGNOSIS — I10 Essential (primary) hypertension: Secondary | ICD-10-CM | POA: Diagnosis not present

## 2022-12-25 DIAGNOSIS — N186 End stage renal disease: Secondary | ICD-10-CM | POA: Diagnosis not present

## 2022-12-26 DIAGNOSIS — N186 End stage renal disease: Secondary | ICD-10-CM | POA: Diagnosis not present

## 2022-12-27 DIAGNOSIS — Z8673 Personal history of transient ischemic attack (TIA), and cerebral infarction without residual deficits: Secondary | ICD-10-CM | POA: Diagnosis not present

## 2022-12-27 DIAGNOSIS — N186 End stage renal disease: Secondary | ICD-10-CM | POA: Diagnosis not present

## 2022-12-27 DIAGNOSIS — Z01818 Encounter for other preprocedural examination: Secondary | ICD-10-CM | POA: Diagnosis not present

## 2022-12-27 DIAGNOSIS — R9431 Abnormal electrocardiogram [ECG] [EKG]: Secondary | ICD-10-CM | POA: Diagnosis not present

## 2022-12-27 DIAGNOSIS — I150 Renovascular hypertension: Secondary | ICD-10-CM | POA: Diagnosis not present

## 2022-12-28 DIAGNOSIS — N186 End stage renal disease: Secondary | ICD-10-CM | POA: Diagnosis not present

## 2022-12-29 DIAGNOSIS — N186 End stage renal disease: Secondary | ICD-10-CM | POA: Diagnosis not present

## 2022-12-30 DIAGNOSIS — N186 End stage renal disease: Secondary | ICD-10-CM | POA: Diagnosis not present

## 2022-12-31 DIAGNOSIS — N186 End stage renal disease: Secondary | ICD-10-CM | POA: Diagnosis not present

## 2023-01-01 DIAGNOSIS — N186 End stage renal disease: Secondary | ICD-10-CM | POA: Diagnosis not present

## 2023-01-02 DIAGNOSIS — N186 End stage renal disease: Secondary | ICD-10-CM | POA: Diagnosis not present

## 2023-01-03 DIAGNOSIS — D509 Iron deficiency anemia, unspecified: Secondary | ICD-10-CM | POA: Diagnosis not present

## 2023-01-03 DIAGNOSIS — N186 End stage renal disease: Secondary | ICD-10-CM | POA: Diagnosis not present

## 2023-01-03 DIAGNOSIS — D631 Anemia in chronic kidney disease: Secondary | ICD-10-CM | POA: Diagnosis not present

## 2023-01-03 DIAGNOSIS — Z23 Encounter for immunization: Secondary | ICD-10-CM | POA: Diagnosis not present

## 2023-01-03 DIAGNOSIS — Z4932 Encounter for adequacy testing for peritoneal dialysis: Secondary | ICD-10-CM | POA: Diagnosis not present

## 2023-01-03 DIAGNOSIS — N2581 Secondary hyperparathyroidism of renal origin: Secondary | ICD-10-CM | POA: Diagnosis not present

## 2023-01-04 DIAGNOSIS — Z23 Encounter for immunization: Secondary | ICD-10-CM | POA: Diagnosis not present

## 2023-01-04 DIAGNOSIS — N2581 Secondary hyperparathyroidism of renal origin: Secondary | ICD-10-CM | POA: Diagnosis not present

## 2023-01-04 DIAGNOSIS — Z4932 Encounter for adequacy testing for peritoneal dialysis: Secondary | ICD-10-CM | POA: Diagnosis not present

## 2023-01-04 DIAGNOSIS — N186 End stage renal disease: Secondary | ICD-10-CM | POA: Diagnosis not present

## 2023-01-04 DIAGNOSIS — D509 Iron deficiency anemia, unspecified: Secondary | ICD-10-CM | POA: Diagnosis not present

## 2023-01-04 DIAGNOSIS — D631 Anemia in chronic kidney disease: Secondary | ICD-10-CM | POA: Diagnosis not present

## 2023-01-05 DIAGNOSIS — N186 End stage renal disease: Secondary | ICD-10-CM | POA: Diagnosis not present

## 2023-01-05 DIAGNOSIS — Z114 Encounter for screening for human immunodeficiency virus [HIV]: Secondary | ICD-10-CM | POA: Diagnosis not present

## 2023-01-05 DIAGNOSIS — Z4932 Encounter for adequacy testing for peritoneal dialysis: Secondary | ICD-10-CM | POA: Diagnosis not present

## 2023-01-05 DIAGNOSIS — D631 Anemia in chronic kidney disease: Secondary | ICD-10-CM | POA: Diagnosis not present

## 2023-01-05 DIAGNOSIS — Z7682 Awaiting organ transplant status: Secondary | ICD-10-CM | POA: Diagnosis not present

## 2023-01-05 DIAGNOSIS — N2581 Secondary hyperparathyroidism of renal origin: Secondary | ICD-10-CM | POA: Diagnosis not present

## 2023-01-05 DIAGNOSIS — Z23 Encounter for immunization: Secondary | ICD-10-CM | POA: Diagnosis not present

## 2023-01-05 DIAGNOSIS — D509 Iron deficiency anemia, unspecified: Secondary | ICD-10-CM | POA: Diagnosis not present

## 2023-01-05 DIAGNOSIS — Z01818 Encounter for other preprocedural examination: Secondary | ICD-10-CM | POA: Diagnosis not present

## 2023-01-06 DIAGNOSIS — Z23 Encounter for immunization: Secondary | ICD-10-CM | POA: Diagnosis not present

## 2023-01-06 DIAGNOSIS — D509 Iron deficiency anemia, unspecified: Secondary | ICD-10-CM | POA: Diagnosis not present

## 2023-01-06 DIAGNOSIS — Z4932 Encounter for adequacy testing for peritoneal dialysis: Secondary | ICD-10-CM | POA: Diagnosis not present

## 2023-01-06 DIAGNOSIS — N186 End stage renal disease: Secondary | ICD-10-CM | POA: Diagnosis not present

## 2023-01-06 DIAGNOSIS — N2581 Secondary hyperparathyroidism of renal origin: Secondary | ICD-10-CM | POA: Diagnosis not present

## 2023-01-06 DIAGNOSIS — D631 Anemia in chronic kidney disease: Secondary | ICD-10-CM | POA: Diagnosis not present

## 2023-01-07 DIAGNOSIS — D509 Iron deficiency anemia, unspecified: Secondary | ICD-10-CM | POA: Diagnosis not present

## 2023-01-07 DIAGNOSIS — N186 End stage renal disease: Secondary | ICD-10-CM | POA: Diagnosis not present

## 2023-01-07 DIAGNOSIS — Z4932 Encounter for adequacy testing for peritoneal dialysis: Secondary | ICD-10-CM | POA: Diagnosis not present

## 2023-01-07 DIAGNOSIS — N2581 Secondary hyperparathyroidism of renal origin: Secondary | ICD-10-CM | POA: Diagnosis not present

## 2023-01-07 DIAGNOSIS — Z23 Encounter for immunization: Secondary | ICD-10-CM | POA: Diagnosis not present

## 2023-01-07 DIAGNOSIS — D631 Anemia in chronic kidney disease: Secondary | ICD-10-CM | POA: Diagnosis not present

## 2023-01-08 DIAGNOSIS — N2581 Secondary hyperparathyroidism of renal origin: Secondary | ICD-10-CM | POA: Diagnosis not present

## 2023-01-08 DIAGNOSIS — D631 Anemia in chronic kidney disease: Secondary | ICD-10-CM | POA: Diagnosis not present

## 2023-01-08 DIAGNOSIS — N186 End stage renal disease: Secondary | ICD-10-CM | POA: Diagnosis not present

## 2023-01-08 DIAGNOSIS — D509 Iron deficiency anemia, unspecified: Secondary | ICD-10-CM | POA: Diagnosis not present

## 2023-01-08 DIAGNOSIS — Z23 Encounter for immunization: Secondary | ICD-10-CM | POA: Diagnosis not present

## 2023-01-08 DIAGNOSIS — Z4932 Encounter for adequacy testing for peritoneal dialysis: Secondary | ICD-10-CM | POA: Diagnosis not present

## 2023-01-09 DIAGNOSIS — D631 Anemia in chronic kidney disease: Secondary | ICD-10-CM | POA: Diagnosis not present

## 2023-01-09 DIAGNOSIS — D509 Iron deficiency anemia, unspecified: Secondary | ICD-10-CM | POA: Diagnosis not present

## 2023-01-09 DIAGNOSIS — Z23 Encounter for immunization: Secondary | ICD-10-CM | POA: Diagnosis not present

## 2023-01-09 DIAGNOSIS — N186 End stage renal disease: Secondary | ICD-10-CM | POA: Diagnosis not present

## 2023-01-09 DIAGNOSIS — N2581 Secondary hyperparathyroidism of renal origin: Secondary | ICD-10-CM | POA: Diagnosis not present

## 2023-01-09 DIAGNOSIS — Z4932 Encounter for adequacy testing for peritoneal dialysis: Secondary | ICD-10-CM | POA: Diagnosis not present

## 2023-01-10 DIAGNOSIS — N186 End stage renal disease: Secondary | ICD-10-CM | POA: Diagnosis not present

## 2023-01-10 DIAGNOSIS — Z4932 Encounter for adequacy testing for peritoneal dialysis: Secondary | ICD-10-CM | POA: Diagnosis not present

## 2023-01-10 DIAGNOSIS — N2581 Secondary hyperparathyroidism of renal origin: Secondary | ICD-10-CM | POA: Diagnosis not present

## 2023-01-10 DIAGNOSIS — Z23 Encounter for immunization: Secondary | ICD-10-CM | POA: Diagnosis not present

## 2023-01-10 DIAGNOSIS — D509 Iron deficiency anemia, unspecified: Secondary | ICD-10-CM | POA: Diagnosis not present

## 2023-01-10 DIAGNOSIS — D631 Anemia in chronic kidney disease: Secondary | ICD-10-CM | POA: Diagnosis not present

## 2023-01-11 DIAGNOSIS — D509 Iron deficiency anemia, unspecified: Secondary | ICD-10-CM | POA: Diagnosis not present

## 2023-01-11 DIAGNOSIS — Z4932 Encounter for adequacy testing for peritoneal dialysis: Secondary | ICD-10-CM | POA: Diagnosis not present

## 2023-01-11 DIAGNOSIS — D631 Anemia in chronic kidney disease: Secondary | ICD-10-CM | POA: Diagnosis not present

## 2023-01-11 DIAGNOSIS — Z23 Encounter for immunization: Secondary | ICD-10-CM | POA: Diagnosis not present

## 2023-01-11 DIAGNOSIS — N2581 Secondary hyperparathyroidism of renal origin: Secondary | ICD-10-CM | POA: Diagnosis not present

## 2023-01-11 DIAGNOSIS — N186 End stage renal disease: Secondary | ICD-10-CM | POA: Diagnosis not present

## 2023-01-12 DIAGNOSIS — D631 Anemia in chronic kidney disease: Secondary | ICD-10-CM | POA: Diagnosis not present

## 2023-01-12 DIAGNOSIS — D509 Iron deficiency anemia, unspecified: Secondary | ICD-10-CM | POA: Diagnosis not present

## 2023-01-12 DIAGNOSIS — Z4932 Encounter for adequacy testing for peritoneal dialysis: Secondary | ICD-10-CM | POA: Diagnosis not present

## 2023-01-12 DIAGNOSIS — N186 End stage renal disease: Secondary | ICD-10-CM | POA: Diagnosis not present

## 2023-01-12 DIAGNOSIS — N2581 Secondary hyperparathyroidism of renal origin: Secondary | ICD-10-CM | POA: Diagnosis not present

## 2023-01-12 DIAGNOSIS — Z23 Encounter for immunization: Secondary | ICD-10-CM | POA: Diagnosis not present

## 2023-01-13 DIAGNOSIS — N186 End stage renal disease: Secondary | ICD-10-CM | POA: Diagnosis not present

## 2023-01-14 DIAGNOSIS — N186 End stage renal disease: Secondary | ICD-10-CM | POA: Diagnosis not present

## 2023-01-15 DIAGNOSIS — N186 End stage renal disease: Secondary | ICD-10-CM | POA: Diagnosis not present

## 2023-01-17 DIAGNOSIS — N186 End stage renal disease: Secondary | ICD-10-CM | POA: Diagnosis not present

## 2023-01-18 DIAGNOSIS — N186 End stage renal disease: Secondary | ICD-10-CM | POA: Diagnosis not present

## 2023-01-19 DIAGNOSIS — N186 End stage renal disease: Secondary | ICD-10-CM | POA: Diagnosis not present

## 2023-01-20 DIAGNOSIS — N186 End stage renal disease: Secondary | ICD-10-CM | POA: Diagnosis not present

## 2023-01-21 DIAGNOSIS — N186 End stage renal disease: Secondary | ICD-10-CM | POA: Diagnosis not present

## 2023-01-22 DIAGNOSIS — N186 End stage renal disease: Secondary | ICD-10-CM | POA: Diagnosis not present

## 2023-01-23 DIAGNOSIS — N186 End stage renal disease: Secondary | ICD-10-CM | POA: Diagnosis not present

## 2023-01-24 DIAGNOSIS — N186 End stage renal disease: Secondary | ICD-10-CM | POA: Diagnosis not present

## 2023-01-25 DIAGNOSIS — N186 End stage renal disease: Secondary | ICD-10-CM | POA: Diagnosis not present

## 2023-01-26 DIAGNOSIS — N186 End stage renal disease: Secondary | ICD-10-CM | POA: Diagnosis not present

## 2023-01-27 DIAGNOSIS — N186 End stage renal disease: Secondary | ICD-10-CM | POA: Diagnosis not present

## 2023-01-28 DIAGNOSIS — N186 End stage renal disease: Secondary | ICD-10-CM | POA: Diagnosis not present

## 2023-01-29 DIAGNOSIS — N186 End stage renal disease: Secondary | ICD-10-CM | POA: Diagnosis not present

## 2023-01-30 DIAGNOSIS — Z01818 Encounter for other preprocedural examination: Secondary | ICD-10-CM | POA: Diagnosis not present

## 2023-01-30 DIAGNOSIS — N186 End stage renal disease: Secondary | ICD-10-CM | POA: Diagnosis not present

## 2023-01-31 DIAGNOSIS — N186 End stage renal disease: Secondary | ICD-10-CM | POA: Diagnosis not present

## 2023-02-01 DIAGNOSIS — N186 End stage renal disease: Secondary | ICD-10-CM | POA: Diagnosis not present

## 2023-02-02 DIAGNOSIS — M792 Neuralgia and neuritis, unspecified: Secondary | ICD-10-CM | POA: Diagnosis not present

## 2023-02-02 DIAGNOSIS — M7732 Calcaneal spur, left foot: Secondary | ICD-10-CM | POA: Diagnosis not present

## 2023-02-03 DIAGNOSIS — N186 End stage renal disease: Secondary | ICD-10-CM | POA: Diagnosis not present

## 2023-02-03 DIAGNOSIS — D509 Iron deficiency anemia, unspecified: Secondary | ICD-10-CM | POA: Diagnosis not present

## 2023-02-03 DIAGNOSIS — D631 Anemia in chronic kidney disease: Secondary | ICD-10-CM | POA: Diagnosis not present

## 2023-02-03 DIAGNOSIS — N2581 Secondary hyperparathyroidism of renal origin: Secondary | ICD-10-CM | POA: Diagnosis not present

## 2023-02-04 DIAGNOSIS — D631 Anemia in chronic kidney disease: Secondary | ICD-10-CM | POA: Diagnosis not present

## 2023-02-04 DIAGNOSIS — D509 Iron deficiency anemia, unspecified: Secondary | ICD-10-CM | POA: Diagnosis not present

## 2023-02-04 DIAGNOSIS — N2581 Secondary hyperparathyroidism of renal origin: Secondary | ICD-10-CM | POA: Diagnosis not present

## 2023-02-04 DIAGNOSIS — N186 End stage renal disease: Secondary | ICD-10-CM | POA: Diagnosis not present

## 2023-02-05 DIAGNOSIS — N2581 Secondary hyperparathyroidism of renal origin: Secondary | ICD-10-CM | POA: Diagnosis not present

## 2023-02-05 DIAGNOSIS — D509 Iron deficiency anemia, unspecified: Secondary | ICD-10-CM | POA: Diagnosis not present

## 2023-02-05 DIAGNOSIS — D631 Anemia in chronic kidney disease: Secondary | ICD-10-CM | POA: Diagnosis not present

## 2023-02-05 DIAGNOSIS — N186 End stage renal disease: Secondary | ICD-10-CM | POA: Diagnosis not present

## 2023-02-06 DIAGNOSIS — D509 Iron deficiency anemia, unspecified: Secondary | ICD-10-CM | POA: Diagnosis not present

## 2023-02-06 DIAGNOSIS — N2581 Secondary hyperparathyroidism of renal origin: Secondary | ICD-10-CM | POA: Diagnosis not present

## 2023-02-06 DIAGNOSIS — D631 Anemia in chronic kidney disease: Secondary | ICD-10-CM | POA: Diagnosis not present

## 2023-02-06 DIAGNOSIS — N186 End stage renal disease: Secondary | ICD-10-CM | POA: Diagnosis not present

## 2023-02-07 DIAGNOSIS — N186 End stage renal disease: Secondary | ICD-10-CM | POA: Diagnosis not present

## 2023-02-07 DIAGNOSIS — D509 Iron deficiency anemia, unspecified: Secondary | ICD-10-CM | POA: Diagnosis not present

## 2023-02-07 DIAGNOSIS — D631 Anemia in chronic kidney disease: Secondary | ICD-10-CM | POA: Diagnosis not present

## 2023-02-07 DIAGNOSIS — N2581 Secondary hyperparathyroidism of renal origin: Secondary | ICD-10-CM | POA: Diagnosis not present

## 2023-02-08 DIAGNOSIS — D631 Anemia in chronic kidney disease: Secondary | ICD-10-CM | POA: Diagnosis not present

## 2023-02-08 DIAGNOSIS — N2581 Secondary hyperparathyroidism of renal origin: Secondary | ICD-10-CM | POA: Diagnosis not present

## 2023-02-08 DIAGNOSIS — N186 End stage renal disease: Secondary | ICD-10-CM | POA: Diagnosis not present

## 2023-02-08 DIAGNOSIS — D509 Iron deficiency anemia, unspecified: Secondary | ICD-10-CM | POA: Diagnosis not present

## 2023-02-09 DIAGNOSIS — D509 Iron deficiency anemia, unspecified: Secondary | ICD-10-CM | POA: Diagnosis not present

## 2023-02-09 DIAGNOSIS — N2581 Secondary hyperparathyroidism of renal origin: Secondary | ICD-10-CM | POA: Diagnosis not present

## 2023-02-09 DIAGNOSIS — D631 Anemia in chronic kidney disease: Secondary | ICD-10-CM | POA: Diagnosis not present

## 2023-02-09 DIAGNOSIS — N186 End stage renal disease: Secondary | ICD-10-CM | POA: Diagnosis not present

## 2023-02-10 DIAGNOSIS — D509 Iron deficiency anemia, unspecified: Secondary | ICD-10-CM | POA: Diagnosis not present

## 2023-02-10 DIAGNOSIS — N2581 Secondary hyperparathyroidism of renal origin: Secondary | ICD-10-CM | POA: Diagnosis not present

## 2023-02-10 DIAGNOSIS — D631 Anemia in chronic kidney disease: Secondary | ICD-10-CM | POA: Diagnosis not present

## 2023-02-10 DIAGNOSIS — N186 End stage renal disease: Secondary | ICD-10-CM | POA: Diagnosis not present

## 2023-02-11 DIAGNOSIS — N2581 Secondary hyperparathyroidism of renal origin: Secondary | ICD-10-CM | POA: Diagnosis not present

## 2023-02-11 DIAGNOSIS — N186 End stage renal disease: Secondary | ICD-10-CM | POA: Diagnosis not present

## 2023-02-11 DIAGNOSIS — D631 Anemia in chronic kidney disease: Secondary | ICD-10-CM | POA: Diagnosis not present

## 2023-02-11 DIAGNOSIS — D509 Iron deficiency anemia, unspecified: Secondary | ICD-10-CM | POA: Diagnosis not present

## 2023-06-23 DIAGNOSIS — I10 Essential (primary) hypertension: Secondary | ICD-10-CM | POA: Diagnosis not present

## 2023-06-23 DIAGNOSIS — N186 End stage renal disease: Secondary | ICD-10-CM | POA: Diagnosis not present

## 2023-06-23 DIAGNOSIS — D631 Anemia in chronic kidney disease: Secondary | ICD-10-CM | POA: Diagnosis not present

## 2023-06-23 DIAGNOSIS — D509 Iron deficiency anemia, unspecified: Secondary | ICD-10-CM | POA: Diagnosis not present

## 2023-06-23 DIAGNOSIS — N2581 Secondary hyperparathyroidism of renal origin: Secondary | ICD-10-CM | POA: Diagnosis not present

## 2023-07-24 DIAGNOSIS — I10 Essential (primary) hypertension: Secondary | ICD-10-CM | POA: Diagnosis not present

## 2023-07-24 DIAGNOSIS — N186 End stage renal disease: Secondary | ICD-10-CM | POA: Diagnosis not present

## 2023-07-24 DIAGNOSIS — N2581 Secondary hyperparathyroidism of renal origin: Secondary | ICD-10-CM | POA: Diagnosis not present

## 2023-07-24 DIAGNOSIS — D509 Iron deficiency anemia, unspecified: Secondary | ICD-10-CM | POA: Diagnosis not present

## 2023-07-24 DIAGNOSIS — D631 Anemia in chronic kidney disease: Secondary | ICD-10-CM | POA: Diagnosis not present

## 2023-08-09 DIAGNOSIS — K219 Gastro-esophageal reflux disease without esophagitis: Secondary | ICD-10-CM | POA: Diagnosis not present

## 2023-08-09 DIAGNOSIS — Z992 Dependence on renal dialysis: Secondary | ICD-10-CM | POA: Diagnosis not present

## 2023-08-09 DIAGNOSIS — I120 Hypertensive chronic kidney disease with stage 5 chronic kidney disease or end stage renal disease: Secondary | ICD-10-CM | POA: Diagnosis not present

## 2023-08-09 DIAGNOSIS — N186 End stage renal disease: Secondary | ICD-10-CM | POA: Diagnosis not present

## 2023-08-09 DIAGNOSIS — G629 Polyneuropathy, unspecified: Secondary | ICD-10-CM | POA: Diagnosis not present

## 2023-08-09 DIAGNOSIS — Z7982 Long term (current) use of aspirin: Secondary | ICD-10-CM | POA: Diagnosis not present

## 2023-08-09 DIAGNOSIS — D84822 Immunodeficiency due to external causes: Secondary | ICD-10-CM | POA: Diagnosis not present

## 2023-08-13 DIAGNOSIS — I10 Essential (primary) hypertension: Secondary | ICD-10-CM | POA: Diagnosis not present

## 2023-08-13 DIAGNOSIS — N186 End stage renal disease: Secondary | ICD-10-CM | POA: Diagnosis not present

## 2023-08-13 DIAGNOSIS — E785 Hyperlipidemia, unspecified: Secondary | ICD-10-CM | POA: Diagnosis not present

## 2023-08-13 DIAGNOSIS — Z992 Dependence on renal dialysis: Secondary | ICD-10-CM | POA: Diagnosis not present

## 2023-08-13 DIAGNOSIS — M109 Gout, unspecified: Secondary | ICD-10-CM | POA: Diagnosis not present

## 2023-08-23 DIAGNOSIS — D631 Anemia in chronic kidney disease: Secondary | ICD-10-CM | POA: Diagnosis not present

## 2023-08-23 DIAGNOSIS — N186 End stage renal disease: Secondary | ICD-10-CM | POA: Diagnosis not present

## 2023-08-23 DIAGNOSIS — N2581 Secondary hyperparathyroidism of renal origin: Secondary | ICD-10-CM | POA: Diagnosis not present

## 2023-08-23 DIAGNOSIS — I10 Essential (primary) hypertension: Secondary | ICD-10-CM | POA: Diagnosis not present

## 2023-08-23 DIAGNOSIS — D509 Iron deficiency anemia, unspecified: Secondary | ICD-10-CM | POA: Diagnosis not present

## 2023-08-23 DIAGNOSIS — Z23 Encounter for immunization: Secondary | ICD-10-CM | POA: Diagnosis not present

## 2023-09-23 DIAGNOSIS — N186 End stage renal disease: Secondary | ICD-10-CM | POA: Diagnosis not present

## 2023-09-23 DIAGNOSIS — D509 Iron deficiency anemia, unspecified: Secondary | ICD-10-CM | POA: Diagnosis not present

## 2023-09-23 DIAGNOSIS — N2581 Secondary hyperparathyroidism of renal origin: Secondary | ICD-10-CM | POA: Diagnosis not present

## 2023-09-23 DIAGNOSIS — I10 Essential (primary) hypertension: Secondary | ICD-10-CM | POA: Diagnosis not present

## 2023-09-23 DIAGNOSIS — Z23 Encounter for immunization: Secondary | ICD-10-CM | POA: Diagnosis not present

## 2023-09-23 DIAGNOSIS — D631 Anemia in chronic kidney disease: Secondary | ICD-10-CM | POA: Diagnosis not present

## 2023-10-11 ENCOUNTER — Encounter: Payer: Self-pay | Admitting: Internal Medicine

## 2023-10-12 DIAGNOSIS — W57XXXA Bitten or stung by nonvenomous insect and other nonvenomous arthropods, initial encounter: Secondary | ICD-10-CM | POA: Diagnosis not present

## 2023-10-12 DIAGNOSIS — I10 Essential (primary) hypertension: Secondary | ICD-10-CM | POA: Diagnosis not present

## 2023-10-18 DIAGNOSIS — W57XXXA Bitten or stung by nonvenomous insect and other nonvenomous arthropods, initial encounter: Secondary | ICD-10-CM | POA: Diagnosis not present

## 2023-10-18 DIAGNOSIS — L089 Local infection of the skin and subcutaneous tissue, unspecified: Secondary | ICD-10-CM | POA: Diagnosis not present

## 2023-10-23 DIAGNOSIS — N186 End stage renal disease: Secondary | ICD-10-CM | POA: Diagnosis not present

## 2023-10-23 DIAGNOSIS — I10 Essential (primary) hypertension: Secondary | ICD-10-CM | POA: Diagnosis not present

## 2023-10-23 DIAGNOSIS — D509 Iron deficiency anemia, unspecified: Secondary | ICD-10-CM | POA: Diagnosis not present

## 2023-10-23 DIAGNOSIS — D631 Anemia in chronic kidney disease: Secondary | ICD-10-CM | POA: Diagnosis not present

## 2023-10-23 DIAGNOSIS — N2581 Secondary hyperparathyroidism of renal origin: Secondary | ICD-10-CM | POA: Diagnosis not present

## 2023-10-29 ENCOUNTER — Other Ambulatory Visit: Payer: Self-pay | Admitting: Internal Medicine

## 2023-10-29 DIAGNOSIS — Z1231 Encounter for screening mammogram for malignant neoplasm of breast: Secondary | ICD-10-CM

## 2023-11-15 ENCOUNTER — Ambulatory Visit
Admission: RE | Admit: 2023-11-15 | Discharge: 2023-11-15 | Disposition: A | Discharge status: Home / Self Care | Source: Ambulatory Visit | Attending: Internal Medicine | Admitting: Internal Medicine

## 2023-11-15 DIAGNOSIS — Z1231 Encounter for screening mammogram for malignant neoplasm of breast: Secondary | ICD-10-CM | POA: Diagnosis not present

## 2023-11-23 DIAGNOSIS — D509 Iron deficiency anemia, unspecified: Secondary | ICD-10-CM | POA: Diagnosis not present

## 2023-11-23 DIAGNOSIS — N186 End stage renal disease: Secondary | ICD-10-CM | POA: Diagnosis not present

## 2023-11-23 DIAGNOSIS — D631 Anemia in chronic kidney disease: Secondary | ICD-10-CM | POA: Diagnosis not present

## 2023-11-23 DIAGNOSIS — N2581 Secondary hyperparathyroidism of renal origin: Secondary | ICD-10-CM | POA: Diagnosis not present

## 2023-11-23 DIAGNOSIS — Z23 Encounter for immunization: Secondary | ICD-10-CM | POA: Diagnosis not present

## 2023-12-24 DIAGNOSIS — N2581 Secondary hyperparathyroidism of renal origin: Secondary | ICD-10-CM | POA: Diagnosis not present

## 2023-12-24 DIAGNOSIS — D631 Anemia in chronic kidney disease: Secondary | ICD-10-CM | POA: Diagnosis not present

## 2023-12-24 DIAGNOSIS — N186 End stage renal disease: Secondary | ICD-10-CM | POA: Diagnosis not present

## 2023-12-24 DIAGNOSIS — D509 Iron deficiency anemia, unspecified: Secondary | ICD-10-CM | POA: Diagnosis not present

## 2023-12-24 DIAGNOSIS — I10 Essential (primary) hypertension: Secondary | ICD-10-CM | POA: Diagnosis not present

## 2023-12-31 DIAGNOSIS — E559 Vitamin D deficiency, unspecified: Secondary | ICD-10-CM | POA: Diagnosis not present

## 2023-12-31 DIAGNOSIS — M26639 Articular disc disorder of temporomandibular joint, unspecified side: Secondary | ICD-10-CM | POA: Diagnosis not present

## 2023-12-31 DIAGNOSIS — R110 Nausea: Secondary | ICD-10-CM | POA: Diagnosis not present

## 2023-12-31 DIAGNOSIS — E785 Hyperlipidemia, unspecified: Secondary | ICD-10-CM | POA: Diagnosis not present

## 2023-12-31 DIAGNOSIS — I10 Essential (primary) hypertension: Secondary | ICD-10-CM | POA: Diagnosis not present

## 2023-12-31 DIAGNOSIS — R519 Headache, unspecified: Secondary | ICD-10-CM | POA: Diagnosis not present

## 2023-12-31 DIAGNOSIS — Z992 Dependence on renal dialysis: Secondary | ICD-10-CM | POA: Diagnosis not present

## 2023-12-31 DIAGNOSIS — Z1389 Encounter for screening for other disorder: Secondary | ICD-10-CM | POA: Diagnosis not present

## 2023-12-31 DIAGNOSIS — D638 Anemia in other chronic diseases classified elsewhere: Secondary | ICD-10-CM | POA: Diagnosis not present

## 2023-12-31 DIAGNOSIS — M109 Gout, unspecified: Secondary | ICD-10-CM | POA: Diagnosis not present

## 2023-12-31 DIAGNOSIS — N186 End stage renal disease: Secondary | ICD-10-CM | POA: Diagnosis not present

## 2023-12-31 DIAGNOSIS — Z0000 Encounter for general adult medical examination without abnormal findings: Secondary | ICD-10-CM | POA: Diagnosis not present

## 2024-01-09 DIAGNOSIS — M25522 Pain in left elbow: Secondary | ICD-10-CM | POA: Diagnosis not present

## 2024-01-11 DIAGNOSIS — R519 Headache, unspecified: Secondary | ICD-10-CM | POA: Diagnosis not present

## 2024-01-11 DIAGNOSIS — M26639 Articular disc disorder of temporomandibular joint, unspecified side: Secondary | ICD-10-CM | POA: Diagnosis not present

## 2024-01-23 DIAGNOSIS — D631 Anemia in chronic kidney disease: Secondary | ICD-10-CM | POA: Diagnosis not present

## 2024-01-23 DIAGNOSIS — N186 End stage renal disease: Secondary | ICD-10-CM | POA: Diagnosis not present

## 2024-01-23 DIAGNOSIS — N2581 Secondary hyperparathyroidism of renal origin: Secondary | ICD-10-CM | POA: Diagnosis not present

## 2024-01-23 DIAGNOSIS — D509 Iron deficiency anemia, unspecified: Secondary | ICD-10-CM | POA: Diagnosis not present

## 2024-01-23 DIAGNOSIS — Z23 Encounter for immunization: Secondary | ICD-10-CM | POA: Diagnosis not present

## 2024-02-23 DIAGNOSIS — N186 End stage renal disease: Secondary | ICD-10-CM | POA: Diagnosis not present

## 2024-02-23 DIAGNOSIS — N2581 Secondary hyperparathyroidism of renal origin: Secondary | ICD-10-CM | POA: Diagnosis not present

## 2024-02-23 DIAGNOSIS — D509 Iron deficiency anemia, unspecified: Secondary | ICD-10-CM | POA: Diagnosis not present

## 2024-02-23 DIAGNOSIS — D631 Anemia in chronic kidney disease: Secondary | ICD-10-CM | POA: Diagnosis not present
